# Patient Record
Sex: Female | Born: 1955 | Race: Black or African American | Hispanic: No | Marital: Single | Smoking: Former smoker
Health system: Southern US, Community
[De-identification: ages and names within clinical notes are randomized; demographics above are authoritative.]

## PROBLEM LIST (undated history)

## (undated) DIAGNOSIS — C159 Malignant neoplasm of esophagus, unspecified: Secondary | ICD-10-CM

## (undated) DIAGNOSIS — J690 Pneumonitis due to inhalation of food and vomit: Secondary | ICD-10-CM

## (undated) DIAGNOSIS — I10 Essential (primary) hypertension: Secondary | ICD-10-CM

## (undated) DIAGNOSIS — J860 Pyothorax with fistula: Secondary | ICD-10-CM

## (undated) DIAGNOSIS — R000 Tachycardia, unspecified: Secondary | ICD-10-CM

---

## 2014-07-18 HISTORY — PX: PEG PLACEMENT: SHX5437

## 2014-07-28 HISTORY — PX: TRACHEOSTOMY: SUR1362

## 2014-08-31 LAB — HEMOGLOBIN A1C: Hgb A1c MFr Bld: 5.4 % (ref 4.0–6.0)

## 2014-09-10 LAB — POCT INR: INR: 1 (ref <=1.1)

## 2014-09-10 LAB — PROTIME-INR: PROTIME: 13.8 s (ref 10.0–13.8)

## 2014-09-11 LAB — HEPATIC FUNCTION PANEL
ALT: 14 U/L (ref 7–35)
AST: 20 U/L (ref 13–35)
Alkaline Phosphatase: 92 U/L (ref 25–125)

## 2014-09-18 LAB — CBC AND DIFFERENTIAL
HCT: 28 % — AB (ref 36–46)
Hemoglobin: 9.5 g/dL — AB (ref 12.0–16.0)
Platelets: 759 10*3/uL — AB (ref 150–399)
WBC: 10.1 10*3/mL

## 2014-09-20 LAB — BASIC METABOLIC PANEL
BUN: 9 mg/dL (ref 4–21)
CREATININE: 0.6 mg/dL (ref <=1.1)
GLUCOSE: 108 mg/dL
POTASSIUM: 4.5 mmol/L (ref 3.4–5.3)
Sodium: 132 mmol/L — AB (ref 137–147)

## 2014-09-22 ENCOUNTER — Encounter: Payer: Self-pay | Admitting: Family Medicine

## 2014-09-22 ENCOUNTER — Encounter: Payer: Self-pay | Admitting: Clinical

## 2014-09-22 DIAGNOSIS — I10 Essential (primary) hypertension: Secondary | ICD-10-CM | POA: Insufficient documentation

## 2014-09-22 DIAGNOSIS — F411 Generalized anxiety disorder: Secondary | ICD-10-CM | POA: Insufficient documentation

## 2014-09-22 DIAGNOSIS — C159 Malignant neoplasm of esophagus, unspecified: Secondary | ICD-10-CM | POA: Insufficient documentation

## 2014-09-22 DIAGNOSIS — R000 Tachycardia, unspecified: Secondary | ICD-10-CM | POA: Insufficient documentation

## 2014-09-22 DIAGNOSIS — J9601 Acute respiratory failure with hypoxia: Secondary | ICD-10-CM | POA: Insufficient documentation

## 2014-09-22 DIAGNOSIS — Z720 Tobacco use: Secondary | ICD-10-CM | POA: Insufficient documentation

## 2014-09-22 DIAGNOSIS — F1010 Alcohol abuse, uncomplicated: Secondary | ICD-10-CM | POA: Insufficient documentation

## 2014-09-22 DIAGNOSIS — R918 Other nonspecific abnormal finding of lung field: Secondary | ICD-10-CM | POA: Insufficient documentation

## 2014-09-22 DIAGNOSIS — R64 Cachexia: Secondary | ICD-10-CM | POA: Insufficient documentation

## 2014-09-22 DIAGNOSIS — D649 Anemia, unspecified: Secondary | ICD-10-CM | POA: Insufficient documentation

## 2014-09-22 NOTE — Progress Notes (Signed)
Thank you Dr. Ree Kida for placing the order. It has been sent to [facility].  Hunt Oris, MSW, [place]

## 2014-09-22 NOTE — Progress Notes (Signed)
[place] agencies were not able to accept pt's [place] order either due to low staffing, not accepting insurance or unable to treat pt's with a trach. CSW was informed by [employer] that pt will need to have a face to face in clinic as a face to face in the out of state hospital will not be accepted. CSW also informed that pt's family has received education on how to manage pt's trach and other needs. CSW will await an update from PCP to determine what home health needs (if any) pt has. CSW will call daughter Bambi with pt's appt for 11/17 at 1:30p.  Hunt Oris, MSW, [place]

## 2014-09-22 NOTE — Progress Notes (Signed)
Received call from Dr. Myrene Galas at [facility] on 09/21/14. She is currently caring for Shelby Armstrong.  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 She is s/p esophogeal stent on 07/28/14. She is also s/p tracheostomy on 07/28/14. Her recent hospitalization has been complicated by sepsis due to aspiration PNA. She is status post XRT radiation however oncology is not recommending chemo at this time. Patient and family are hesitant to enter Hospice. Family members which to bring Shelby Armstrong back to [place]. Family wishes to establish care at [facility]. Needs order for home health. I have agreed to accept the patient into my practice and am willing to place the order for home health.  I have discussed this patient with my social worker Hunt Oris who will help to facilitate the home health.   Dossie Arbour MD

## 2014-09-26 ENCOUNTER — Ambulatory Visit: Payer: BC Managed Care – PPO | Admitting: Family Medicine

## 2014-09-26 ENCOUNTER — Telehealth: Payer: Self-pay | Admitting: Clinical

## 2014-09-26 NOTE — Telephone Encounter (Signed)
CSW called daughter again and apologized that pts appointtment was actually today. Daughter agreeable to bring pt tomorrow at 3:30p. PCP agreeable to see pt tomorrow, 09/27/14.  Hunt Oris, MSW, [place]

## 2014-09-26 NOTE — Telephone Encounter (Signed)
CSW called pt's daughter to inform her that pt has an appt tomorrow at 1:30p with her PCP. Daughter agreeable to bring pt.and states that pt is doing well, "she's up talking."  CSW also received pt's dc summary from [facility]. A copy has been placed in PCP's box and another to be scanned into pt's chart.  Hunt Oris, MSW, [place]

## 2014-09-27 ENCOUNTER — Ambulatory Visit (INDEPENDENT_AMBULATORY_CARE_PROVIDER_SITE_OTHER): Payer: BC Managed Care – PPO | Admitting: Family Medicine

## 2014-09-27 ENCOUNTER — Encounter: Payer: Self-pay | Admitting: Family Medicine

## 2014-09-27 VITALS — BP 138/78 | HR 137 | Temp 98.4°F | Ht 61.0 in | Wt 82.8 lb

## 2014-09-27 DIAGNOSIS — R0781 Pleurodynia: Secondary | ICD-10-CM

## 2014-09-27 DIAGNOSIS — I10 Essential (primary) hypertension: Secondary | ICD-10-CM

## 2014-09-27 DIAGNOSIS — R000 Tachycardia, unspecified: Secondary | ICD-10-CM

## 2014-09-27 DIAGNOSIS — C159 Malignant neoplasm of esophagus, unspecified: Secondary | ICD-10-CM

## 2014-09-27 NOTE — Patient Instructions (Signed)
It was a pleasure to meet you today.  You have been referred to an oncologist. My office will contact you in the next few days.  Side pain - likely due to muscle spasm/increased muscle use due to breathing, apply heat 2-3 times per day and my use Tylenol (1-2 325 mg tablets three times a day as needed). If your pain is not improved please call and I will provided a stronger pain medication.   Return for follow up after you have been seen by the oncologist.

## 2014-09-28 DIAGNOSIS — R0781 Pleurodynia: Secondary | ICD-10-CM | POA: Insufficient documentation

## 2014-09-28 NOTE — Assessment & Plan Note (Signed)
Bilateral MSK/rib pain. Likely due to increased work of breathing from collapsed left lung. -attempt trial of heat and Tylenol -If no improvement will add Tramadol to pain regimen

## 2014-09-28 NOTE — Assessment & Plan Note (Signed)
Blood pressure in acceptable range on Amlodipine and Metoprolol.

## 2014-09-28 NOTE — Assessment & Plan Note (Signed)
Patient has persistent tachycardia. Echo and CTA at [facility] unremarkable. Likely due to [facility] and cachectic state. -will monitor clinically at this time

## 2014-09-28 NOTE — Progress Notes (Signed)
   Subjective:    Patient ID: Shelby Armstrong, female    DOB: 02-16-1956, 58 y.o.   MRN: [id number]  HPI 58 y/o female presents for establishment of care and face to face visit for home health. She is accompanied today by her daughter Shelby Armstrong.  [name] was discharged from the [facility] on 09/22/14. She was admitted 2/2 SCC of the esophagus. 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 This occlusion was due to external compression from the [facility] of the esophagus. She also was found to have a tracheoesophageal fistula that required esophageal stenting. She required PEG and trach placement. The trach has since been removed. She underwent palliative radiation however the oncology team at [facility] felt that she was not a candidate for chemotherapy. Per the primary team a discussion on hospice was had with the patient and family however they refused hospice services. Patient and family interested in second opinion.   [name] is currently living with her daughter. She requires home health services due to inability to perform ADLs without assistance. Today she complains of bilateral side pain (over the ribs), made worse by laying down, no associated rash, she reports some sob, no cough, no fevers/chills, no dysuria, no abdominal pain. She is tolerating tube feeds. Tube feeds are being delivered by [facility]  I have reviewed the medical records and discharge summary from [facility]. I have reviewed and updated the patients PMH/PSH/Social hx/Medications/Allergies and updated in EPIC.    Review of Systems  Constitutional: Positive for fatigue. Negative for fever and chills.  Respiratory: Positive for shortness of breath. Negative for choking.   Cardiovascular: Negative for chest pain.  Gastrointestinal: Negative for nausea, vomiting, abdominal pain and diarrhea.       Objective:   Physical Exam Vitals:  reviewed, tachycardic Gen: pleasant AAF, cachectic  HEENT: normocphalic, PERRL, EOMI, nasal septum midline, no rhinorrhea, MMM, uvula midline, no pharyngeal erythema or exudate noted, neck supple, no adenopathy, trach stoma present (appears to be healing well) Cardiac: Tachycardic, no murmurs, no heaves/thrills, No JVD Resp: no breath sounds in left lung, right lung clear to auscultation, normal work of breathing MSK: bilateral lower rib pain to palpation,no joint effusion Skin: no rash, no suspicious skin lesions Abd: soft, no tenderness, PEG tube present, no erythema or drainage from ostomy Ext: thin, 2+ radial and DP pulses, no edema Psych: dressed appropriately, daughter did most of talking, A&O x3, affect appeared flat     Assessment & Plan:  Please see problem specific assessment and plan.

## 2014-09-28 NOTE — Assessment & Plan Note (Addendum)
58 y/o female presents for establishment of care. She was discharged from [facility] on 09/22/14. Known SCC of esophagus. -Patient very weak/cachectic from [facility] and hospitalization, requires home health, order has been placed, [facility] [place] SW working to get home heath coordinated, will also place order for home health PT services -Patient and family decline hospice services, wish for second opinion first, referral placed to Oncology at [facility]

## 2014-09-29 ENCOUNTER — Encounter: Payer: Self-pay | Admitting: Family Medicine

## 2014-09-29 NOTE — Addendum Note (Signed)
Addended by: Lupita Dawn on: 09/29/2014 09:01 AM   Modules accepted: Orders, Level of Service

## 2014-09-29 NOTE — Progress Notes (Signed)
CSW has sent orders to [facility] and confirmed that they will accept pt.  Shelby Armstrong, MSW, [place]

## 2014-10-03 ENCOUNTER — Telehealth: Payer: Self-pay | Admitting: Hematology

## 2014-10-03 NOTE — Telephone Encounter (Signed)
left message for patient to return call to schedule np appt.  °

## 2014-10-04 ENCOUNTER — Telehealth: Payer: Self-pay | Admitting: Hematology

## 2014-10-04 NOTE — Telephone Encounter (Signed)
S/W PATIENT DTR AND GAVE NP APPT FOR 12/07 @ 2:30 W/DR. FENG DX- SCC REFERRING DR. FLETKE WELCOME PACKET MAILED W/CALENDAR  INFORMATION IN EPIC FOR REVIEW

## 2014-10-04 NOTE — Telephone Encounter (Signed)
LEFT MESSAGE FOR PATIENT TO RETURN CALL TO SCHEDULE NP APPT.  °

## 2014-10-09 ENCOUNTER — Telehealth: Payer: Self-pay | Admitting: *Deleted

## 2014-10-09 MED ORDER — TRAMADOL HCL 50 MG PO TABS: 50.0000 mg | ORAL_TABLET | Freq: Three times a day (TID) | ORAL | Status: AC | PRN

## 2014-10-09 NOTE — Telephone Encounter (Signed)
Spoke to patient's daughter. Patient continues to have back and hip pain. Not controlled with PRN tylenol. Will attempt trial of Tramadol.   Note to nursing staff - please call in Tramadol 50 mg TID PRN pain, dispense #30, refill #0, thanks

## 2014-10-09 NOTE — Telephone Encounter (Signed)
Will forward to PCP 

## 2014-10-09 NOTE — Telephone Encounter (Signed)
Rx called in to [facility] on [address]

## 2014-10-09 NOTE — Telephone Encounter (Signed)
Pt dgt calls, she states that Dr. Ree Kida told them that if her mom was having uncontrollable pain to call and he would give her something stronger. Latanya Hemmer, [place]

## 2014-10-11 ENCOUNTER — Inpatient Hospital Stay (HOSPITAL_COMMUNITY)
Admission: EM | Admit: 2014-10-11 | Discharge: 2014-11-10 | DRG: 377 | Disposition: E | Payer: BC Managed Care – PPO | Attending: Family Medicine | Admitting: Family Medicine

## 2014-10-11 ENCOUNTER — Encounter (HOSPITAL_COMMUNITY): Payer: Self-pay | Admitting: *Deleted

## 2014-10-11 ENCOUNTER — Emergency Department (HOSPITAL_COMMUNITY): Payer: BC Managed Care – PPO

## 2014-10-11 DIAGNOSIS — E43 Unspecified severe protein-calorie malnutrition: Secondary | ICD-10-CM | POA: Diagnosis present

## 2014-10-11 DIAGNOSIS — R000 Tachycardia, unspecified: Secondary | ICD-10-CM

## 2014-10-11 DIAGNOSIS — I480 Paroxysmal atrial fibrillation: Secondary | ICD-10-CM | POA: Diagnosis present

## 2014-10-11 DIAGNOSIS — R011 Cardiac murmur, unspecified: Secondary | ICD-10-CM | POA: Diagnosis present

## 2014-10-11 DIAGNOSIS — Z803 Family history of malignant neoplasm of breast: Secondary | ICD-10-CM | POA: Diagnosis not present

## 2014-10-11 DIAGNOSIS — Z7401 Bed confinement status: Secondary | ICD-10-CM

## 2014-10-11 DIAGNOSIS — Z923 Personal history of irradiation: Secondary | ICD-10-CM

## 2014-10-11 DIAGNOSIS — C7951 Secondary malignant neoplasm of bone: Secondary | ICD-10-CM | POA: Diagnosis present

## 2014-10-11 DIAGNOSIS — D649 Anemia, unspecified: Secondary | ICD-10-CM | POA: Diagnosis present

## 2014-10-11 DIAGNOSIS — D638 Anemia in other chronic diseases classified elsewhere: Secondary | ICD-10-CM | POA: Diagnosis present

## 2014-10-11 DIAGNOSIS — F411 Generalized anxiety disorder: Secondary | ICD-10-CM | POA: Diagnosis present

## 2014-10-11 DIAGNOSIS — Z7982 Long term (current) use of aspirin: Secondary | ICD-10-CM

## 2014-10-11 DIAGNOSIS — R64 Cachexia: Secondary | ICD-10-CM | POA: Diagnosis present

## 2014-10-11 DIAGNOSIS — E860 Dehydration: Secondary | ICD-10-CM | POA: Diagnosis present

## 2014-10-11 DIAGNOSIS — I10 Essential (primary) hypertension: Secondary | ICD-10-CM | POA: Diagnosis present

## 2014-10-11 DIAGNOSIS — C159 Malignant neoplasm of esophagus, unspecified: Secondary | ICD-10-CM | POA: Diagnosis present

## 2014-10-11 DIAGNOSIS — Z801 Family history of malignant neoplasm of trachea, bronchus and lung: Secondary | ICD-10-CM

## 2014-10-11 DIAGNOSIS — J9811 Atelectasis: Secondary | ICD-10-CM | POA: Diagnosis present

## 2014-10-11 DIAGNOSIS — G8929 Other chronic pain: Secondary | ICD-10-CM | POA: Diagnosis present

## 2014-10-11 DIAGNOSIS — M7989 Other specified soft tissue disorders: Secondary | ICD-10-CM | POA: Diagnosis present

## 2014-10-11 DIAGNOSIS — R627 Adult failure to thrive: Secondary | ICD-10-CM | POA: Diagnosis present

## 2014-10-11 DIAGNOSIS — R451 Restlessness and agitation: Secondary | ICD-10-CM | POA: Diagnosis not present

## 2014-10-11 DIAGNOSIS — Z931 Gastrostomy status: Secondary | ICD-10-CM | POA: Diagnosis not present

## 2014-10-11 DIAGNOSIS — R6510 Systemic inflammatory response syndrome (SIRS) of non-infectious origin without acute organ dysfunction: Secondary | ICD-10-CM | POA: Diagnosis present

## 2014-10-11 DIAGNOSIS — K922 Gastrointestinal hemorrhage, unspecified: Secondary | ICD-10-CM | POA: Diagnosis present

## 2014-10-11 DIAGNOSIS — J90 Pleural effusion, not elsewhere classified: Secondary | ICD-10-CM | POA: Diagnosis present

## 2014-10-11 DIAGNOSIS — Z87891 Personal history of nicotine dependence: Secondary | ICD-10-CM | POA: Diagnosis not present

## 2014-10-11 DIAGNOSIS — E871 Hypo-osmolality and hyponatremia: Secondary | ICD-10-CM | POA: Diagnosis present

## 2014-10-11 DIAGNOSIS — M799 Soft tissue disorder, unspecified: Secondary | ICD-10-CM | POA: Diagnosis not present

## 2014-10-11 DIAGNOSIS — Z66 Do not resuscitate: Secondary | ICD-10-CM | POA: Diagnosis present

## 2014-10-11 DIAGNOSIS — Z681 Body mass index (BMI) 19 or less, adult: Secondary | ICD-10-CM | POA: Diagnosis not present

## 2014-10-11 DIAGNOSIS — R0682 Tachypnea, not elsewhere classified: Secondary | ICD-10-CM

## 2014-10-11 DIAGNOSIS — Z79899 Other long term (current) drug therapy: Secondary | ICD-10-CM | POA: Diagnosis not present

## 2014-10-11 DIAGNOSIS — Z930 Tracheostomy status: Secondary | ICD-10-CM

## 2014-10-11 DIAGNOSIS — K5900 Constipation, unspecified: Secondary | ICD-10-CM | POA: Diagnosis present

## 2014-10-11 DIAGNOSIS — R062 Wheezing: Secondary | ICD-10-CM

## 2014-10-11 HISTORY — DX: Pneumonitis due to inhalation of food and vomit: J69.0

## 2014-10-11 HISTORY — DX: Pyothorax with fistula: J86.0

## 2014-10-11 HISTORY — DX: Tachycardia, unspecified: R00.0

## 2014-10-11 HISTORY — DX: Essential (primary) hypertension: I10

## 2014-10-11 HISTORY — DX: Malignant neoplasm of esophagus, unspecified: C15.9

## 2014-10-11 LAB — CBC WITH DIFFERENTIAL/PLATELET
BASOS ABS: 0 10*3/uL (ref 0.0–0.1)
Basophils Relative: 0 % (ref 0–1)
Eosinophils Absolute: 0.1 10*3/uL (ref 0.0–0.7)
Eosinophils Relative: 0 % (ref 0–5)
HCT: 32.7 % — ABNORMAL LOW (ref 36.0–46.0)
Hemoglobin: 11 g/dL — ABNORMAL LOW (ref 12.0–15.0)
LYMPHS PCT: 7 % — AB (ref 12–46)
Lymphs Abs: 1 10*3/uL (ref 0.7–4.0)
MCH: 28.8 pg (ref 26.0–34.0)
MCHC: 33.6 g/dL (ref 30.0–36.0)
MCV: 85.6 fL (ref 78.0–100.0)
Monocytes Absolute: 1.2 10*3/uL — ABNORMAL HIGH (ref 0.1–1.0)
Monocytes Relative: 9 % (ref 3–12)
Neutro Abs: 11.3 10*3/uL — ABNORMAL HIGH (ref 1.7–7.7)
Neutrophils Relative %: 84 % — ABNORMAL HIGH (ref 43–77)
PLATELETS: 756 10*3/uL — AB (ref 150–400)
RBC: 3.82 MIL/uL — AB (ref 3.87–5.11)
RDW: 16.8 % — AB (ref 11.5–15.5)
WBC: 13.4 10*3/uL — ABNORMAL HIGH (ref 4.0–10.5)

## 2014-10-11 LAB — COMPREHENSIVE METABOLIC PANEL
ALT: 16 U/L (ref 0–35)
AST: 47 U/L — ABNORMAL HIGH (ref 0–37)
Albumin: 2.5 g/dL — ABNORMAL LOW (ref 3.5–5.2)
Alkaline Phosphatase: 98 U/L (ref 39–117)
Anion gap: 14 (ref 5–15)
BUN: 21 mg/dL (ref 6–23)
CO2: 28 meq/L (ref 19–32)
CREATININE: 0.48 mg/dL — AB (ref 0.50–1.10)
Calcium: 13.5 mg/dL (ref 8.4–10.5)
Chloride: 90 mEq/L — ABNORMAL LOW (ref 96–112)
Glucose, Bld: 98 mg/dL (ref 70–99)
Potassium: 4.7 mEq/L (ref 3.7–5.3)
Sodium: 132 mEq/L — ABNORMAL LOW (ref 137–147)
TOTAL PROTEIN: 9.1 g/dL — AB (ref 6.0–8.3)
Total Bilirubin: 0.3 mg/dL (ref 0.3–1.2)

## 2014-10-11 LAB — URINALYSIS, ROUTINE W REFLEX MICROSCOPIC
Bilirubin Urine: NEGATIVE
Glucose, UA: NEGATIVE mg/dL
Hgb urine dipstick: NEGATIVE
Ketones, ur: NEGATIVE mg/dL
LEUKOCYTES UA: NEGATIVE
Nitrite: NEGATIVE
Protein, ur: NEGATIVE mg/dL
Specific Gravity, Urine: 1.013 (ref 1.005–1.030)
UROBILINOGEN UA: 0.2 mg/dL (ref 0.0–1.0)
pH: 7 (ref 5.0–8.0)

## 2014-10-11 LAB — LACTIC ACID, PLASMA: LACTIC ACID, VENOUS: 1.8 mmol/L (ref 0.5–2.2)

## 2014-10-11 LAB — TROPONIN I: Troponin I: <0.3 ng/mL (ref <0.30)

## 2014-10-11 LAB — OCCULT BLOOD GASTRIC / DUODENUM (SPECIMEN CUP): OCCULT BLOOD, GASTRIC: POSITIVE — AB

## 2014-10-11 LAB — LIPASE, BLOOD: Lipase: 12 U/L (ref 11–59)

## 2014-10-11 MED ORDER — JEVITY 1.2 CAL PO LIQD
237.0000 mL | Freq: Four times a day (QID) | ORAL | Status: DC
  Administered 2014-10-11 – 2014-10-12 (×4): 237 mL
  Filled 2014-10-11 (×9): qty 237

## 2014-10-11 MED ORDER — SODIUM CHLORIDE 0.9 % IV BOLUS (SEPSIS)
250.0000 mL | Freq: Once | INTRAVENOUS | Status: AC
  Administered 2014-10-11: 250 mL via INTRAVENOUS

## 2014-10-11 MED ORDER — ACETAMINOPHEN 325 MG PO TABS
650.0000 mg | ORAL_TABLET | Freq: Three times a day (TID) | ORAL | Status: DC | PRN
Start: 2014-10-11 — End: 2014-10-17

## 2014-10-11 MED ORDER — IRON DEXTRAN-FOLIC ACID-B12 100-1000-15 MG-MCG/5ML PO LIQD: 5.0000 mL | Freq: Every day | ORAL | Status: DC

## 2014-10-11 MED ORDER — CALCITONIN (SALMON) 200 UNIT/ML IJ SOLN
4.0000 [IU]/kg | Freq: Two times a day (BID) | INTRAMUSCULAR | Status: DC
  Administered 2014-10-11 – 2014-10-15 (×8): 150 [IU] via SUBCUTANEOUS
  Administered 2014-10-16: 4 [IU] via SUBCUTANEOUS
  Administered 2014-10-16 – 2014-10-17 (×2): 150 [IU] via SUBCUTANEOUS
  Filled 2014-10-11 (×18): qty 0.75

## 2014-10-11 MED ORDER — AMLODIPINE BESYLATE 10 MG PO TABS
10.0000 mg | ORAL_TABLET | Freq: Every day | ORAL | Status: DC
  Administered 2014-10-11 – 2014-10-13 (×3): 10 mg
  Filled 2014-10-11 (×4): qty 1

## 2014-10-11 MED ORDER — METOPROLOL TARTRATE 50 MG PO TABS
50.0000 mg | ORAL_TABLET | Freq: Two times a day (BID) | ORAL | Status: DC
  Administered 2014-10-11 – 2014-10-17 (×12): 50 mg
  Filled 2014-10-11 (×3): qty 1
  Filled 2014-10-11: qty 2
  Filled 2014-10-11 (×11): qty 1

## 2014-10-11 MED ORDER — TRAMADOL HCL 50 MG PO TABS
50.0000 mg | ORAL_TABLET | Freq: Three times a day (TID) | ORAL | Status: DC | PRN
  Administered 2014-10-11 – 2014-10-12 (×2): 50 mg via ORAL
  Filled 2014-10-11 (×3): qty 1

## 2014-10-11 MED ORDER — DULOXETINE HCL 30 MG PO CPEP
30.0000 mg | ORAL_CAPSULE | Freq: Every day | ORAL | Status: DC
  Administered 2014-10-12 – 2014-10-15 (×4): 30 mg via ORAL
  Filled 2014-10-11 (×4): qty 1

## 2014-10-11 MED ORDER — IRON DEXTRAN-FOLIC ACID-B12 100-1000-15 MG-MCG/5ML PO LIQD
100.0000 ug | Freq: Every day | ORAL | Status: DC
  Filled 2014-10-11 (×2): qty 5

## 2014-10-11 MED ORDER — FUROSEMIDE 10 MG/ML IJ SOLN
20.0000 mg | Freq: Once | INTRAMUSCULAR | Status: AC
  Administered 2014-10-11: 20 mg via INTRAVENOUS
  Filled 2014-10-11: qty 2

## 2014-10-11 MED ORDER — SODIUM CHLORIDE 0.9 % IV SOLN
INTRAVENOUS | Status: DC
  Administered 2014-10-11: 11:00:00 via INTRAVENOUS

## 2014-10-11 MED ORDER — SODIUM CHLORIDE 0.9 % IV SOLN
INTRAVENOUS | Status: DC
  Administered 2014-10-11 – 2014-10-12 (×4): via INTRAVENOUS
  Administered 2014-10-14: 75 mL via INTRAVENOUS

## 2014-10-11 MED ORDER — VITAMIN B-1 100 MG PO TABS
100.0000 mg | ORAL_TABLET | Freq: Every day | ORAL | Status: DC
  Administered 2014-10-11 – 2014-10-15 (×5): 100 mg
  Filled 2014-10-11 (×5): qty 1

## 2014-10-11 MED ORDER — SODIUM CHLORIDE 0.9 % IV SOLN: INTRAVENOUS | Status: DC

## 2014-10-11 MED ORDER — SODIUM CHLORIDE 0.9 % IV BOLUS (SEPSIS)
500.0000 mL | Freq: Once | INTRAVENOUS | Status: AC
  Administered 2014-10-11: 500 mL via INTRAVENOUS

## 2014-10-11 MED ORDER — PANTOPRAZOLE SODIUM 40 MG PO PACK
40.0000 mg | PACK | Freq: Every day | ORAL | Status: DC
  Administered 2014-10-11 – 2014-10-17 (×7): 40 mg
  Filled 2014-10-11 (×7): qty 20

## 2014-10-11 MED ORDER — FENTANYL CITRATE 0.05 MG/ML IJ SOLN
25.0000 ug | INTRAMUSCULAR | Status: DC | PRN
  Administered 2014-10-11: 25 ug via INTRAVENOUS
  Filled 2014-10-11: qty 2

## 2014-10-11 MED ORDER — JEVITY 1.5 CAL PO LIQD: 1000.0000 mL | Freq: Four times a day (QID) | ORAL | Status: DC

## 2014-10-11 MED ORDER — SODIUM CHLORIDE 0.9 % IJ SOLN
3.0000 mL | Freq: Two times a day (BID) | INTRAMUSCULAR | Status: DC
  Administered 2014-10-12 – 2014-10-17 (×9): 3 mL via INTRAVENOUS

## 2014-10-11 MED ORDER — THIAMINE HCL 100 MG PO TABS: 100.0000 mg | ORAL_TABLET | Freq: Every day | ORAL | Status: DC

## 2014-10-11 NOTE — Progress Notes (Signed)
NURSING PROGRESS NOTE  Nirvi Boehler [id number] Admission Data: 10/14/2014 7:27 PM Attending Provider: Willeen Niece, MD [id number], Mary Sella, MD Code Status: FULL  Shelby Armstrong is a 58 y.o. female patient admitted from ED:  -No acute distress noted.  -No complaints of shortness of breath.  -No complaints of chest pain.    Blood pressure 147/85, pulse 139, temperature 98.2 F (36.8 C), temperature source Oral, resp. rate 22, height 5\' 1"  (1.549 m), weight 37.558 kg (82 lb 12.8 oz), SpO2 100 %.   IV Fluids:  IV in place, occlusive dsg intact without redness, IV cath forearm right, condition patent and no redness normal saline.   Allergies:  Review of patient's allergies indicates no known allergies.  Past Medical History:   has a past medical history of Tachycardia; Tracheoesophageal fistula; Hypertension; Aspiration pneumonia (~ 07/2014); and Squamous cell carcinoma of esophagus (dx'd ~ 07/2014).  Past Surgical History:   has past surgical history that includes PEG placement (07/18/14) and Tracheostomy (07/28/14).   Skin: fissure noted to sacrum. Peg tube site noted to LLQ  Patient/Family orientated to room. Information packet given to patient/family. Admission inpatient armband information verified with patient/family to include name and date of birth and placed on patient arm. Side rails up x 2, fall assessment and education completed with patient/family. Patient/family able to verbalize understanding of risk associated with falls and verbalized understanding to call for assistance before getting out of bed. Call light within reach. Patient/family able to voice and demonstrate understanding of unit orientation instructions.    Will continue to evaluate and treat per MD orders.  Wallie Renshaw, RN

## 2014-10-11 NOTE — ED Notes (Signed)
Patient transported to [place] via [facility] 

## 2014-10-11 NOTE — ED Notes (Signed)
Bed: WA22 Expected date:  Expected time:  Means of arrival:  Comments: 

## 2014-10-11 NOTE — ED Notes (Addendum)
Still awaiting [facility] transport to [employer], family updated with status.

## 2014-10-11 NOTE — ED Notes (Signed)
Patient taken to radiology Will obtain labs when patient returns

## 2014-10-11 NOTE — ED Notes (Signed)
Patient states that her feeding tube has been "backed up" since last night Patient had feeding tube placed about 3 months ago, per patient's family Patient noted to have elevated HR in triage--EKG obtained, patient denies CP Patient with hx of esophageal cancer

## 2014-10-11 NOTE — H&P (Addendum)
FMTS Attending Admission Note: Annabell Sabal MD Personal pager:  [phone] FPTS Service Pager:  [phone]  Briefly, 58 yo F recently moved from [place] with diagnosis of esophageal cancer.  Had unsuccessful attempt at radiative palliation at [facility] x 1.  None since then, at least 1 month ago.  Per records, multiple physicians have been recommending Hospice, but family not yet on board.  Has known Left lung collapse/radiographic white-out since before moving from [facility].  Presented with gradual worsening of pain over past days to weeks.  Yesterday family noted what appeared to be brownish reflux through PEG tube, placed due to esophageal CA.  Family concerned for blood, brought to ED.  Txed to [facility] and admitted to [facility].  Exam: Gen:  AAF lying in bed, appears older than stated age.  Cachectic, chronically ill appearing. HEENT:  Arcus present BL.  EOMI, PERRL.  Dry mucus membranes Heart:  Tachy but regular rhythm. Lungs:  Right side with good movement.  Left side with hyper-resonant breath sounds  Abd:  PEG in place without surrounding erythema.  Scaphoid abdomen. Ext:  Roughly 10 cm in diameter firm mass noted posterior Right thigh.  Mildly TTP.    Imp/Plan: 1.  Hypercalcemia:   - likely contributing to feelings of malaise and pain.   - agree with fluids and lasix.  - unknown HF status.  Watch for overload  2.  Possible GI bleed: - stable Hgb.  Gastrocult pending. - Agree with PPI  3.  Esophageal CA:  - poor prognosis.  Very thin, poor nutritional status. - Driving all decisions.  Needs Palliative care consult  4.  Radiographic abnormalities, lungs: - white-out Left lung.  Likely post-obstructive.  Questionable PNA via radiographs -- but same opacity noted in about 1 month ago without change. - no fevers, cough, chills.  Does have leukocytosis.  Favor observation.  5. Leg mass: - Concerning for malignancy. - Doesn't appear fixed -- but she has little leg mass for mass  to adhere - Again, push for palliation.  Would only biopsy to help in prognosis.  6.  Code Status:  - of note, patient DNR STATUS on [facility] paperwork. - need to confirm this with family ASAP  Alveda Reasons, MD 11/06/2014

## 2014-10-11 NOTE — ED Notes (Signed)
Spoke with [facility] who is on their way. Report given to Olivia Mackie, [place]

## 2014-10-11 NOTE — H&P (Signed)
[facility] Admission History and Physical Service Pager: [phone]  Patient name: Shelby Armstrong Medical record number: [id number] Date of birth: 16-Sep-1956 Age: 58 y.o. Gender: female  Primary Care Provider: Lupita Dawn, MD Consultants: None Code Status: Full, confirmed on admission, discussed with patient and family  Chief Complaint: Blood in PEG tube  Assessment and Plan: Shelby Armstrong is a 58 y.o. female presenting with concern for blood in her PEG tube and hypercalcemia . PMH is significant for Esophageal Squamous Cell Carcinoma  # Hypercalcemia. Patient's calcium on admission corrects to 14.7. Patient has prior history per chart review (Ca ~11). Likely related to patient's malignancy. Patient also appears clinically dehydrated likely secondary to hypercalcemia induced diuresis, or potentially dehydration may be contributing to her hypercalcemia.  Her hypercalcemia may also be contributing to patient's overall fatigue and dehydration. Patient currently has normal mental status. She does note diffuse pain and constipation was noted on abd XR, which could be related to elevated calcium. -Will give NS @ 200cc/hr -Calcitonin and Lasix -Consider starting bisphosphonate therapy for long term control -f/u AM CMP -Continue to monitor for mental status changes -Monitor on telemetry  # Possible GI bleed. HgB stable on admission (11.0). No bright red blood to suggest acute GI bleed. -f/u gastric occult blood -Protonix 40mg  daily - if this is a true GI bleed, it is likely a slow bleed given stable Hgb and color of fluid - will need to consider GI vs surgical c/s in the am if occult blood positive  # Esophageal Cancer. Previously followed at [facility] oncology. Recently moved to [facility] for second opinion. Has appointment on 12/7 with onc in [place]. Has diffuse body pains and weakness related to cancer diagnosis. 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  -Home tylenol and tramadol prn pain -All nutrition through PEG tube -Nutrition consulted -NPO at this time - monitor respiratory status given chronic lung issues  # SIRS. Leukocytosis (13.4), tachycardia, and tachypnea (22). Tachycardia chronic per patient and her family. No obvious source of infection. UA and CXR unremarkable. Lactic acid normal. Likely tachy and tachypneic relating to disease burden. - EKG shows sinus tachycardia - f/u urine culture - Initiate broad spectrum antibiotics and obtain blood cultures if patient deteriorates clinically.  - Continue to monitor  # Chronic Anemia. - Continue home iron supplementation  # HTN.  - continue home amlodipine and metoprolol  # Anxiety - continue home cymbalta  # Constipation: evident on imaging. Not a complaint of patient. - consider bowel regimen if no BM in am  # Right posterior thigh mass: concern would be for malignant process given history. Possibly a primary malignant process vs metastasis. - will need to consider further imaging of the right thigh and possible biopsy of this lesion  # Hyponatremia: stable from previous value 3 weeks ago. Potentially related to poor intake, though less likely with feedings through PEG tube. - IVF per above - BMET in am.   FEN/GI: NPO, Nutrition consulted for tube feedings, NS @200cc /hr Prophylaxis: SCDs until GI bleed is ruled out.   Disposition: Admitted to telemetry under attending Dr Lindell Noe pending above management and evaluation.  History of Present Illness: Shelby Armstrong is a 58 y.o. female presenting from [place] ED with blood in her PEG tube since last night. This has not previously happened before. Patient endorses some nausea and nobloody vomiting every other day. She receives  all of her hydration and nutrition through her PEG tube. Last BM yesterday. No bloody stools. Denies constipation or  diarrhea.  Says that she pulled material out of her PEG tube that was "hot."  No subjective fevers, but does endorse some occasional chills.  Otherwise patient is near her baseline status. Patient has a history of esophageal squamous cell carcinoma and was previously followed at [facility] oncology. She recently moved to the area for a second opinion after [facility] told the patient there was nothing left to do for her care and that she should pursue hospice care. Patient has history of "whited out" left lung secondary to post obstructive atelectasis from her cancer. Says that she has increased shortness of breath, but is near her baseline. Has some intermittent coughing with clear sputum and phlegm production. Also reports chronic weakess all over with diffuse body pains that is at her baseline. Additionally reports numbness in all of her extremities that comes and goes. Specifically notes this in her bilateral hands today. When referring to her baseline previously this means since her hospitalization at [facility]. Per review of her records from [facility] she had persistent tachycardia while she was hospitalized and was noted to have an elevated calcium while there as well. Hypercalcemia at that time responded to IVF. Tachycardia at that time was felt to be related to disease burden. Had extensive work-up including echo, troponins, and CTA chest that were all unremarkable for cause for tachycardia. Of note the patient was a DNR/DNI while hospitalized in [facility]. Family notes last radiation therapy was >3 weeks ago. Patient also notes a solid mass on the posterior of her right thigh. She notes this started out small and has grown over several weeks. No apparent pain from this.   In the ED at [facility], initial work up was remarkable for hypercalcemia (Ca 13.5), mild hyponatremia (132), leukocytosis (13.4). Patient's hemoglobin was stable at 11.0. CXR revealed white out of left lung and small left basilar pneumothorax.  Review Of  Systems: Per HPI, otherwise 12 point review of systems was performed and was unremarkable.  Patient Active Problem List   Diagnosis Date Noted  . Squamous cell carcinoma of esophagus 11/07/2014  . Rib pain 09/28/2014  . SCC (squamous cell carcinoma of esophagus) 09/22/2014  . Essential hypertension, benign 09/22/2014  . Alcohol abuse 09/22/2014  . Tobacco abuse 09/22/2014  . Anxiety state 09/22/2014  . Cachexia 09/22/2014  . Lung mass 09/22/2014  . Tachycardia 09/22/2014  . Normocytic anemia 09/22/2014   Past Medical History: Past Medical History  Diagnosis Date  . Squamous cell carcinoma of esophagus   . Tachycardia   . Tracheoesophageal fistula   . Aspiration pneumonia    Past Surgical History: Past Surgical History  Procedure Laterality Date  . Peg placement  07/18/14  . Tracheostomy  07/28/14   Social History: History  Substance Use Topics  . Smoking status: Former Smoker    Start date: 11/10/1976    Quit date: 05/10/2014  . Smokeless tobacco: Not on file  . Alcohol Use: 0.0 oz/week    0 Not specified per week     Comment: History of alchohol abuse   Additional social history: Recently moved to [facility] for second opinion for cancer management Please also refer to relevant sections of EMR.  Family History: Family History  Problem Relation Age of Onset  . Hypertension Mother   . Alcoholism Father   . Asthma Sister   . Breast cancer Sister   . Hypertension  Sister   . Lung cancer Brother    Allergies and Medications: No Known Allergies No current facility-administered medications on file prior to encounter.   Current Outpatient Prescriptions on File Prior to Encounter  Medication Sig Dispense Refill  . acetaminophen (TYLENOL) 325 MG tablet Take 650 mg by mouth every 8 (eight) hours as needed for mild pain.     Marland Kitchen amLODipine (NORVASC) 10 MG tablet 10 mg by PEG Tube route daily.     . DULoxetine (CYMBALTA) 30 MG capsule Take 30 mg by mouth daily.    .  metoprolol (LOPRESSOR) 50 MG tablet 50 mg by PEG Tube route 2 (two) times daily.     . Multiple Vitamin (MULTIVITAMIN) tablet Take 1 tablet by mouth daily.    Marland Kitchen thiamine 100 MG tablet 100 mg by PEG Tube route daily.     . traMADol (ULTRAM) 50 MG tablet Take 1 tablet (50 mg total) by mouth every 8 (eight) hours as needed. (Patient taking differently: Take 50 mg by mouth every 8 (eight) hours as needed for moderate pain. ) 30 tablet 0    Objective: BP 147/81 mmHg  Pulse 139  Temp(Src) 98.2 F (36.8 C) (Oral)  Resp 22  Ht 5\' 1"  (1.549 m)  Wt 82 lb 12.8 oz (37.558 kg)  BMI 15.65 kg/m2  SpO2 100% Exam: General: Thin, Chronically ill appearing, cachetic woman lying in bed in NAD HEENT: Dry appearing MMM, PERRL, EOMI Cardiovascular: Tachycardic, no murmurs appreciated Respiratory: NWOB, upper airway sounds transmitted Abdomen: PEG tube in place with dark red/brown material, +BS, soft, tender around site of PEG, nondistended, no rebound or guarding.  Extremities: No cyanosis, right posterior thigh with tennis ball sized firm mobile mass noted, non tender, no erythema Skin: No rashes or breakdowns noted. Neuro: alert and oriented. CN2-12 intact. Strength 5/5 in upper and lower extremities bilaterally. Sensation to gross touch intact bilaterally.   Labs and Imaging: CBC BMET   Recent Labs Lab 11/04/2014 1015  WBC 13.4*  HGB 11.0*  HCT 32.7*  PLT 756*    Recent Labs Lab 10/24/2014 1015  NA 132*  K 4.7  CL 90*  CO2 28  BUN 21  CREATININE 0.48*  GLUCOSE 98  CALCIUM 13.5*     Urinalysis    Component Value Date/Time   COLORURINE YELLOW 10/26/2014 [place] 10/12/2014 0925   LABSPEC 1.013 10/18/2014 0925   PHURINE 7.0 10/12/2014 0925   GLUCOSEU NEGATIVE 11/01/2014 0925   HGBUR NEGATIVE 10/19/2014 0925   BILIRUBINUR NEGATIVE 10/15/2014 0925   KETONESUR NEGATIVE 10/29/2014 0925   PROTEINUR NEGATIVE 11/05/2014 0925   UROBILINOGEN 0.2 10/14/2014 0925   NITRITE  NEGATIVE 11/07/2014 0925   LEUKOCYTESUR NEGATIVE 10/18/2014 0925    Lactic acid 1.8 Troponin negative EKG: Sinus tachycardia, no acute signs of ischemia  Dg Abd Acute W/chest  10/29/2014   CLINICAL DATA:  Diffuse abdominal pain and nausea. History of esophageal carcinoma.  EXAM: ACUTE ABDOMEN SERIES (ABDOMEN 2 VIEW & CHEST 1 VIEW)  COMPARISON:  None.  FINDINGS: Esophageal stent is in place. There is near complete white out of the left chest with volume loss. Small loculated pneumothorax is seen in the left lower lung zone. The right lung is clear.  Two views of the abdomen show a PEG tube in place which projects in good position. No free intraperitoneal air or evidence of bowel obstruction is identified. Large volume of stool in the rectosigmoid colon is noted.  IMPRESSION: No acute finding  in the abdomen with a large volume of stool in the rectosigmoid colon.  Near complete white of the left chest compatible with effusion and atelectasis. There appears to be a small loculated left basilar pneumothorax.   Electronically Signed   By: Inge Rise M.D.   On: 10/14/2014 09:51   Dimas Chyle, MD 10/21/2014, 3:15 PM PGY-1, [facility] pager: [phone], text pages welcome Upper Level Addendum:  I have seen and evaluated this patient along with Dr. Jerline Pain and reviewed the above note, making necessary revisions in red.   Tommi Rumps, MD Family Medicine PGY-2

## 2014-10-11 NOTE — ED Provider Notes (Signed)
CSN: [id number]     Arrival date & time 11/04/2014  0831 History   First MD Initiated Contact with Patient 11/09/2014 0915     Chief Complaint  Patient presents with  . Abdominal Pain    Feeding tube issue     HPI Pt was seen at 0930.  Per pt and her family, c/o gradual onset and persistence of constant "brown fluid from her PEG tube" that began last night. Pt's daughter states pt has hx of SCC esophagus with complicated clinical course at [facility] last month. Oncology MD at [facility] recommended hospice care, but family states "we brought her to [facility] for a second opinion." Pt has been evaluated by [facility] clinic 2 weeks ago and referred to Heme/Onc MD, but has not been evaluated there yet. Pt is mostly bed bound due to chronic pain "all over due to her cancer." Pt has been taking tylenol and tramadol for her pain. Denies any change in pt's usual chronic cough, no fevers, no CP/SOB, no abd pain, no N/V/D, no black or red blood in PEG or stools.    Past Medical History  Diagnosis Date  . Squamous cell carcinoma of esophagus   . Tachycardia   . Tracheoesophageal fistula   . Aspiration pneumonia    Past Surgical History  Procedure Laterality Date  . Peg placement  07/18/14  . Tracheostomy  07/28/14   Family History  Problem Relation Age of Onset  . Hypertension Mother   . Alcoholism Father   . Asthma Sister   . Breast cancer Sister   . Hypertension Sister   . Lung cancer Brother    History  Substance Use Topics  . Smoking status: Former Smoker    Start date: 11/10/1976    Quit date: 05/10/2014  . Smokeless tobacco: Not on file  . Alcohol Use: 0.0 oz/week    0 Not specified per week     Comment: History of alchohol abuse    Review of Systems ROS: Statement: All systems negative except as marked or noted in the HPI; Constitutional: Negative for fever and chills. ; ; Eyes: Negative for eye pain, redness and discharge. ; ; ENMT: Negative for ear pain, hoarseness, nasal congestion, sinus pressure and  sore throat. ; ; Cardiovascular: Negative for chest pain, palpitations, diaphoresis, dyspnea and peripheral edema. ; ; Respiratory: +chronic cough. Negative for wheezing and stridor. ; ; Gastrointestinal: +"brown fluid from PEG tube." Negative for nausea, vomiting, diarrhea, abdominal pain, blood in stool, hematemesis, jaundice and rectal bleeding. . ; ; Genitourinary: Negative for dysuria, flank pain and hematuria. ; ; Musculoskeletal: Negative for back pain and neck pain. Negative for swelling and trauma.; ; Skin: Negative for pruritus, rash, abrasions, blisters, bruising and skin lesion.; ; Neuro: Negative for headache, lightheadedness and neck stiffness. Negative for weakness, altered level of consciousness , altered mental status, extremity weakness, paresthesias, involuntary movement, seizure and syncope.      Allergies  Review of patient's allergies indicates no known allergies.  Home Medications   Prior to Admission medications   Medication Sig Start Date End Date Taking? Authorizing Provider  acetaminophen (TYLENOL) 325 MG tablet Take 650 mg by mouth every 8 (eight) hours as needed for mild pain.    Yes Historical Provider, MD  amLODipine (NORVASC) 10 MG tablet 10 mg by PEG Tube route daily.    Yes Historical Provider, MD  aspirin 325 MG EC tablet Take 325 mg by mouth daily.   Yes Historical Provider, MD  DULoxetine (CYMBALTA)  30 MG capsule Take 30 mg by mouth daily.   Yes Historical Provider, MD  esomeprazole (NEXIUM) 40 MG packet Take 40 mg by mouth daily before breakfast.   Yes Historical Provider, MD  Iron Dextran-Folic [id number] [id number] MG-MCG/5ML LIQD 100 mcg by PEG Tube route daily.   Yes Historical Provider, MD  metoprolol (LOPRESSOR) 50 MG tablet 50 mg by PEG Tube route 2 (two) times daily.    Yes Historical Provider, MD  Multiple Vitamin (MULTIVITAMIN) tablet Take 1 tablet by mouth daily.   Yes Historical Provider, MD  Nutritional Supplements (FEEDING SUPPLEMENT, JEVITY 1.5  CAL,) LIQD Place 1,000 mLs into feeding tube 4 (four) times daily.   Yes Historical Provider, MD  thiamine 100 MG tablet 100 mg by PEG Tube route daily.    Yes Historical Provider, MD  traMADol (ULTRAM) 50 MG tablet Take 1 tablet (50 mg total) by mouth every 8 (eight) hours as needed. Patient taking differently: Take 50 mg by mouth every 8 (eight) hours as needed for moderate pain.  10/09/14  Yes Lupita Dawn, MD   BP 135/82 mmHg  Pulse 139  Temp(Src) 99.6 F (37.6 C) (Rectal)  Resp 23  SpO2 97% Physical Exam  0935: Physical examination:  Nursing notes reviewed; Vital signs and O2 SAT reviewed;  Constitutional: Cachectic. In no acute distress; Head:  Normocephalic, atraumatic; Eyes: EOMI, PERRL, No scleral icterus; ENMT: Mouth and pharynx normal, Mucous membranes dry and cracked; Neck: Supple, Full range of motion, No lymphadenopathy; Cardiovascular: Tachycardic rate and rhythm, No gallop; Respiratory: Breath sounds coarse & equal bilaterally, No wheezes.  Speaking full sentences with ease, Normal respiratory effort/excursion; Chest: Nontender, Movement normal; Abdomen: Soft, +PEG in place without surrounding erythema or drainage. Nontender, Nondistended, Normal bowel sounds; Genitourinary: No CVA tenderness; Extremities: Pulses normal, No tenderness, No edema, No calf edema or asymmetry.; Neuro: AA&Ox3, Major CN grossly intact.  Speech clear. Moves all extremities on stretcher spontaneously.; Skin: Color normal, Warm, Dry.   ED Course  Procedures     EKG Interpretation   Date/Time:  Wednesday October 11 2014 08:59:12 EST  on arrival  Ventricular Rate:  138 PR Interval:  54 QRS Duration: 82 QT Interval:  219 QTC Calculation: 332 R Axis:   99 Text Interpretation:  Sinus tachycardia Consider right atrial enlargement  LVH with secondary repolarization abnormality Nonspecific ST and T wave  abnormality Baseline wander Artifact No old tracing to compare Confirmed  by [facility]  MD,  Fara Worthy [phone]) on 10/28/2014 9:25:26 AM      EKG Interpretation  Date/Time:  Wednesday October 11 2014 11:33:53 EST  repeat Ventricular Rate:  134 PR Interval:  105 QRS Duration: 84 QT Interval:  295 QTC Calculation: 440 R Axis:   97 Text Interpretation:  Sinus tachycardia Consider right atrial enlargement Nonspecific ST and T wave abnormality Artifact Since last tracing of earlier today No significant change was found Confirmed by [facility]  MD, Nunzio Cory [phone]) on 10/13/2014 11:56:21 AM         MDM  MDM Reviewed: previous chart, nursing note and vitals Reviewed previous: labs and ECG Interpretation: labs, ECG and x-ray     Results for orders placed or performed during the hospital encounter of 10/10/2014  Comprehensive metabolic panel  Result Value Ref Range   Sodium 132 (L) 137 - 147 mEq/L   Potassium 4.7 3.7 - 5.3 mEq/L   Chloride 90 (L) 96 - 112 mEq/L   CO2 28 19 - 32 mEq/L   Glucose, Bld 98  70 - 99 mg/dL   BUN 21 6 - 23 mg/dL   Creatinine, Ser 0.48 (L) 0.50 - 1.10 mg/dL   Calcium 13.5 (HH) 8.4 - 10.5 mg/dL   Total Protein 9.1 (H) 6.0 - 8.3 g/dL   Albumin 2.5 (L) 3.5 - 5.2 g/dL   AST 47 (H) 0 - 37 U/L   ALT 16 0 - 35 U/L   Alkaline Phosphatase 98 39 - 117 U/L   Total Bilirubin 0.3 0.3 - 1.2 mg/dL   GFR calc non Af Amer >90 >90 mL/min   GFR calc Af Amer >90 >90 mL/min   Anion gap 14 5 - 15  Lipase, blood  Result Value Ref Range   Lipase 12 11 - 59 U/L  CBC with Differential  Result Value Ref Range   WBC 13.4 (H) 4.0 - 10.5 K/uL   RBC 3.82 (L) 3.87 - 5.11 MIL/uL   Hemoglobin 11.0 (L) 12.0 - 15.0 g/dL   HCT 32.7 (L) 36.0 - 46.0 %   MCV 85.6 78.0 - 100.0 fL   MCH 28.8 26.0 - 34.0 pg   MCHC 33.6 30.0 - 36.0 g/dL   RDW 16.8 (H) 11.5 - 15.5 %   Platelets 756 (H) 150 - 400 K/uL   Neutrophils Relative % 84 (H) 43 - 77 %   Neutro Abs 11.3 (H) 1.7 - 7.7 K/uL   Lymphocytes Relative 7 (L) 12 - 46 %   Lymphs Abs 1.0 0.7 - 4.0 K/uL   Monocytes Relative 9 3 - 12 %    Monocytes Absolute 1.2 (H) 0.1 - 1.0 K/uL   Eosinophils Relative 0 0 - 5 %   Eosinophils Absolute 0.1 0.0 - 0.7 K/uL   Basophils Relative 0 0 - 1 %   Basophils Absolute 0.0 0.0 - 0.1 K/uL  Lactic acid, plasma  Result Value Ref Range   Lactic Acid, Venous 1.8 0.5 - 2.2 mmol/L  Troponin I  Result Value Ref Range   Troponin I <0.30 <0.30 ng/mL  Urinalysis, Routine w reflex microscopic  Result Value Ref Range   Color, Urine YELLOW YELLOW   APPearance CLEAR CLEAR   Specific Gravity, Urine 1.013 1.005 - 1.030   pH 7.0 5.0 - 8.0   Glucose, UA NEGATIVE NEGATIVE mg/dL   Hgb urine dipstick NEGATIVE NEGATIVE   Bilirubin Urine NEGATIVE NEGATIVE   Ketones, ur NEGATIVE NEGATIVE mg/dL   Protein, ur NEGATIVE NEGATIVE mg/dL   Urobilinogen, UA 0.2 0.0 - 1.0 mg/dL   Nitrite NEGATIVE NEGATIVE   Leukocytes, UA NEGATIVE NEGATIVE   Dg Abd Acute W/chest 11/09/2014   CLINICAL DATA:  Diffuse abdominal pain and nausea. History of esophageal carcinoma.  EXAM: ACUTE ABDOMEN SERIES (ABDOMEN 2 VIEW & CHEST 1 VIEW)  COMPARISON:  None.  FINDINGS: Esophageal stent is in place. There is near complete white out of the left chest with volume loss. Small loculated pneumothorax is seen in the left lower lung zone. The right lung is clear.  Two views of the abdomen show a PEG tube in place which projects in good position. No free intraperitoneal air or evidence of bowel obstruction is identified. Large volume of stool in the rectosigmoid colon is noted.  IMPRESSION: No acute finding in the abdomen with a large volume of stool in the rectosigmoid colon.  Near complete white of the left chest compatible with effusion and atelectasis. There appears to be a small loculated left basilar pneumothorax.   Electronically Signed   By: Inge Rise M.D.  On: 10/16/2014 09:51    1150:   EPIC chart reviewed: pt's HR 137 in [place] ofc 2 weeks ago, c/w HR today. Pt with known "white out" on CXR per both of pt's daughters at bedside.  Pt's HR will decrease when she is laying down quiet (HR 120's), then will increase to 180's when she sits up and coughs vigorously. Pt denies specific CP/SOB, states she "just hurts all over like I always do." Will dose IV pain meds and continue IVF for clinical dehydration and hypercalcemia. ED RN pulled fluid from PEG, not coffee ground or frankly bloody, states she only saw "a few flecks of blood" in the gastric fluid. H/H is stable and abd remains benign on exam. Dx and testing d/w pt and family.  Questions answered.  Verb understanding, agreeable to admit. T/C to [facility] Dr. Francene Boyers, case discussed, including:  HPI, pertinent PM/SHx, VS/PE, dx testing, ED course and treatment:  Requests to [facility] at [facility] to admit. T/C to [facility] Resident at [facility], case discussed, including:  HPI, pertinent PM/SHx, VS/PE, dx testing, ED course and treatment:  Agreeable to admit, requests to write temporary orders, obtain tele bed to Dr. Rosario Jacks service.    Francine Graven, DO 10/13/14 [phone]

## 2014-10-11 NOTE — ED Notes (Signed)
Report given to Carlis Abbott, RN  End of assignment

## 2014-10-11 NOTE — Progress Notes (Signed)
Report received from [facility] at [facility] ED for patient to be admitted into [phone]

## 2014-10-12 ENCOUNTER — Telehealth: Payer: Self-pay | Admitting: Clinical

## 2014-10-12 DIAGNOSIS — C159 Malignant neoplasm of esophagus, unspecified: Secondary | ICD-10-CM | POA: Insufficient documentation

## 2014-10-12 DIAGNOSIS — K922 Gastrointestinal hemorrhage, unspecified: Secondary | ICD-10-CM | POA: Diagnosis present

## 2014-10-12 LAB — COMPREHENSIVE METABOLIC PANEL
ALT: 14 U/L (ref 0–35)
AST: 26 U/L (ref 0–37)
Albumin: 2.2 g/dL — ABNORMAL LOW (ref 3.5–5.2)
Alkaline Phosphatase: 92 U/L (ref 39–117)
Anion gap: 15 (ref 5–15)
BILIRUBIN TOTAL: 0.2 mg/dL — AB (ref 0.3–1.2)
BUN: 16 mg/dL (ref 6–23)
CHLORIDE: 100 meq/L (ref 96–112)
CO2: 25 meq/L (ref 19–32)
Calcium: 10.2 mg/dL (ref 8.4–10.5)
Creatinine, Ser: 0.44 mg/dL — ABNORMAL LOW (ref 0.50–1.10)
GFR calc non Af Amer: >90 mL/min (ref >90)
Glucose, Bld: 103 mg/dL — ABNORMAL HIGH (ref 70–99)
Potassium: 3.5 mEq/L — ABNORMAL LOW (ref 3.7–5.3)
Sodium: 140 mEq/L (ref 137–147)
Total Protein: 8 g/dL (ref 6.0–8.3)

## 2014-10-12 LAB — URINE CULTURE
Colony Count: NO GROWTH
Culture: NO GROWTH

## 2014-10-12 LAB — CBC
HCT: 27.8 % — ABNORMAL LOW (ref 36.0–46.0)
Hemoglobin: 9 g/dL — ABNORMAL LOW (ref 12.0–15.0)
MCH: 27.2 pg (ref 26.0–34.0)
MCHC: 32.4 g/dL (ref 30.0–36.0)
MCV: 84 fL (ref 78.0–100.0)
Platelets: 847 10*3/uL — ABNORMAL HIGH (ref 150–400)
RBC: 3.31 MIL/uL — ABNORMAL LOW (ref 3.87–5.11)
RDW: 16.8 % — AB (ref 11.5–15.5)
WBC: 13.1 10*3/uL — ABNORMAL HIGH (ref 4.0–10.5)

## 2014-10-12 MED ORDER — FOLIC ACID 1 MG PO TABS
1.0000 mg | ORAL_TABLET | Freq: Every day | ORAL | Status: DC
  Administered 2014-10-12 – 2014-10-15 (×4): 1 mg
  Filled 2014-10-12 (×4): qty 1

## 2014-10-12 MED ORDER — CETYLPYRIDINIUM CHLORIDE 0.05 % MT LIQD
7.0000 mL | Freq: Two times a day (BID) | OROMUCOSAL | Status: DC
  Administered 2014-10-12 (×2): 7 mL via OROMUCOSAL

## 2014-10-12 MED ORDER — VITAMIN B-12 100 MCG PO TABS
50.0000 ug | ORAL_TABLET | Freq: Every day | ORAL | Status: DC
  Administered 2014-10-12 – 2014-10-15 (×4): 50 ug
  Filled 2014-10-12 (×4): qty 1

## 2014-10-12 MED ORDER — FERROUS SULFATE 300 (60 FE) MG/5ML PO SYRP
300.0000 mg | ORAL_SOLUTION | Freq: Every day | ORAL | Status: DC
  Administered 2014-10-12 – 2014-10-15 (×4): 300 mg
  Filled 2014-10-12 (×4): qty 5

## 2014-10-12 MED ORDER — CHLORHEXIDINE GLUCONATE 0.12 % MT SOLN
15.0000 mL | Freq: Two times a day (BID) | OROMUCOSAL | Status: DC
  Administered 2014-10-12 – 2014-10-17 (×10): 15 mL via OROMUCOSAL
  Filled 2014-10-12 (×11): qty 15

## 2014-10-12 MED ORDER — OSMOLITE 1.5 CAL PO LIQD
237.0000 mL | Freq: Four times a day (QID) | ORAL | Status: DC
  Administered 2014-10-12 – 2014-10-15 (×9): 237 mL
  Filled 2014-10-12 (×22): qty 237

## 2014-10-12 NOTE — Consult Note (Signed)
Subjective:   HPI  The patient is a 29 sure old female with a diagnosis recently of esophageal carcinoma. She underwent radiation treatment at [facility] for this. She also had an esophageal stent placed temporarily but this was subsequently removed. She has a PEG tube in place. It was recommended by several physicians for history that she should he in hospice.  We are asked to see her in regards to coffee-ground-appearing material coming from the PEG tube. Her hemoglobin and hematocrit today were 9 and 27.8. 3 weeks ago her hemoglobin and hematocrit were 9.5 and 28. Yesterday her hemoglobin and hematocrit were 11 and 32.7 when she came in to the hospital but there is no report of any transfusion in the past few weeks. I suspect that the elevated hemoglobin and hematocrit yesterday from 3 weeks ago may have been related to her being somewhat dehydrated and the drop overnight perhaps from hydration. There is no report of any bright red blood.  There is a unilateral left large pleural effusion with white out  Review of Systems Denies chest pain  Past Medical History  Diagnosis Date  . Tachycardia   . Tracheoesophageal fistula   . Hypertension   . Aspiration pneumonia ~ 07/2014  . Squamous cell carcinoma of esophagus dx'd ~ 07/2014   Past Surgical History  Procedure Laterality Date  . Peg placement  07/18/14  . Tracheostomy  07/28/14   History   Social History  . Marital Status: Single    Spouse Name: N/A    Number of Children: N/A  . Years of Education: N/A   Occupational History  . Not on file.   Social History Main Topics  . Smoking status: Former Smoker -- 1.50 packs/day for 38 years    Types: Cigarettes    Start date: 11/10/1976    Quit date: 05/10/2014  . Smokeless tobacco: Never Used  . Alcohol Use: 0.0 oz/week    0 Not specified per week     Comment: History of alchohol abuse  . Drug Use: No  . Sexual Activity: Not on file   Other Topics  Concern  . Not on file   Social History Narrative   family history includes Alcoholism in her father; Asthma in her sister; Breast cancer in her sister; Hypertension in her mother and sister; Lung cancer in her brother. Current facility-administered medications: 0.9 %  sodium chloride infusion, , Intravenous, Continuous, Leone Haven, MD, Last Rate: 100 mL/hr at 10/12/14 0959;  acetaminophen (TYLENOL) tablet 650 mg, 650 mg, Oral, Q8H PRN, Leone Haven, MD;  amLODipine (NORVASC) tablet 10 mg, 10 mg, Per Tube, Daily, Leone Haven, MD, 10 mg at 10/12/14 0959 antiseptic oral rinse (CPC / CETYLPYRIDINIUM CHLORIDE 0.05%) solution 7 mL, 7 mL, Mouth Rinse, q12n4p, Willeen Niece, MD, 7 mL at 10/12/14 1200;  calcitonin (MIACALCIN) injection 150 Units, 4 Units/kg, Subcutaneous, Q12H, Leone Haven, MD, 150 Units at 10/12/14 [phone];  chlorhexidine (PERIDEX) 0.12 % solution 15 mL, 15 mL, Mouth Rinse, BID, Willeen Niece, MD, 15 mL at 10/12/14 0800 DULoxetine (CYMBALTA) DR capsule 30 mg, 30 mg, Oral, Daily, Leone Haven, MD, 30 mg at 10/12/14 7371;  feeding supplement (OSMOLITE 1.5 CAL) liquid 237 mL, 237 mL, Per Tube, QID, Dorann Ou, RD, 237 mL at 10/12/14 1400 ferrous sulfate 300 (60 FE) MG/5ML syrup 300 mg, 300 mg, Per Tube, Daily, 300 mg at 05/05/93 8546 **AND** folic acid (FOLVITE) tablet 1 mg, 1 mg,  Per Tube, Daily, 1 mg at 10/12/14 1244 **AND** vitamin B-12 (CYANOCOBALAMIN) tablet 50 mcg, 50 mcg, Per Tube, Daily, Willeen Niece, MD, 50 mcg at 10/12/14 1244;  metoprolol (LOPRESSOR) tablet 50 mg, 50 mg, Per Tube, BID, Leone Haven, MD, 50 mg at 10/12/14 0959 pantoprazole sodium (PROTONIX) 40 mg/20 mL oral suspension 40 mg, 40 mg, Per Tube, Daily, Leone Haven, MD, 40 mg at 10/12/14 0959;  sodium chloride 0.9 % injection 3 mL, 3 mL, Intravenous, Q12H, Leone Haven, MD, 3 mL at 10/12/14 6945;  thiamine (VITAMIN B-1) tablet 100 mg, 100 mg, Per Tube, Daily, Willeen Niece, MD,  100 mg at 10/12/14 0959 traMADol (ULTRAM) tablet 50 mg, 50 mg, Oral, Q8H PRN, Leone Haven, MD, 50 mg at 10/16/2014 2245 No Known Allergies   Objective:     BP 165/70 mmHg  Pulse 114  Temp(Src) 98.4 F (36.9 C) (Oral)  Resp 20  Ht 5\' 1"  (1.549 m)  Wt 37.558 kg (82 lb 12.8 oz)  BMI 15.65 kg/m2  SpO2 98%  Cachectic appearing female in no acute distress  Heart regular rhythm  Lungs with decreased breath sounds  Abdomen: Bowel sounds present, soft, nontender, no problems seen with PEG tube  Laboratory No components found for: D1    Assessment:     #1. Esophageal carcinoma  #2. Coffee-ground-appearing material seen via the PEG tube, but no significant change in hemoglobin or hematocrit over the last 3 weeks. I do not think we are dealing with any significant gastrointestinal bleeding.      Plan:     Supportive care. PPI therapy. I do not think endoscopy would be of any benefit at this time. Lab Results  Component Value Date   HGB 9.0* 10/12/2014   HGB 11.0* 10/20/2014   HGB 9.5* 09/18/2014   HCT 27.8* 10/12/2014   HCT 32.7* 11/08/2014   HCT 28* 09/18/2014   ALKPHOS 92 10/12/2014   ALKPHOS 98 10/10/2014   ALKPHOS 92 09/11/2014   AST 26 10/12/2014   AST 47* 10/16/2014   AST 20 09/11/2014   ALT 14 10/12/2014   ALT 16 10/20/2014   ALT 14 09/11/2014

## 2014-10-12 NOTE — Progress Notes (Addendum)
INITIAL NUTRITION ASSESSMENT  DOCUMENTATION CODES Per approved criteria  -Severe malnutrition in the context of chronic illness  Pt meets criteria for severe MALNUTRITION in the context of chronic illness as evidenced by severe fat and muscle wasting.  INTERVENTION: Initiate Osmolite 1.5 @ 237 ml QID. TF regimen to provide 1422 kcal, 59 g protein and 720 ml free water. TF will meet 100% of estimated calorie and protein needs.   Recommend free water flushes of 200 ml QID to provide additional 800 ml of free water (1520 ml per day with TF).   NUTRITION DIAGNOSIS: Inadequate oral intake related to inability to eat as evidenced by NPO.   Goal: Pt to meet >/= 90% of their estimated nutrition needs   Monitor:  Weight trend, TF initiation and management, labs  Reason for Assessment: MST and consult for TF initiation and management  58 y.o. female  Admitting Dx: GI bleed  ASSESSMENT: 58 y.o. female presenting with concern for blood in her PEG tube and hypercalcemia . PMH is significant for Esophageal Squamous Cell Carcinoma.  - Pt's weight is stable.  - Per RN, pt was receiving home TF regimen of Jevity 1.5- 237 ml QID which provides 1422 kcal, 60 g protein and 830 ml of free water.  [facility] does not carry pt's regular formula. Will use Osmolite 1.5.   Nutrition Focused Physical Exam:  Subcutaneous Fat:  Orbital Region: severe depletion Upper Arm Region: severe depletion Thoracic and Lumbar Region: severe depletion  Muscle:  Temple Region: severe depletion Clavicle Bone Region: severe depletion Clavicle and Acromion Bone Region: severe depletion Scapular Bone Region: severe depletion Dorsal Hand: severe depletion Patellar Region: severe depletion Anterior Thigh Region: severe depletion Posterior Calf Region: severe depletion  Edema: none  Labs: Na WNL K low BUN WNL Albumin low  Height: Ht Readings from Last 1 Encounters:  10/14/2014 5\' 1"  (1.549 m)     Weight: Wt Readings from Last 1 Encounters:  11/09/2014 82 lb 12.8 oz (37.558 kg)    Ideal Body Weight: 47.8 kg  % Ideal Body Weight: 79%  Wt Readings from Last 10 Encounters:  10/19/2014 82 lb 12.8 oz (37.558 kg)  09/27/14 82 lb 12.8 oz (37.558 kg)    Usual Body Weight: 82 lbs  % Usual Body Weight: 100%  BMI:  Body mass index is 15.65 kg/(m^2).  Estimated Nutritional Needs: Kcal: 1250-1450 Protein: 55-70 g Fluid: 1.5 L/day  Skin: Intact  Diet Order: Diet NPO time specified  EDUCATION NEEDS: -Education needs addressed   Intake/Output Summary (Last 24 hours) at 10/12/14 1301 Last data filed at 10/12/14 0659  Gross per 24 hour  Intake 3193.67 ml  Output      7 ml  Net 3186.67 ml    Last BM: prior to admission   Labs:   Recent Labs Lab 10/10/2014 1015 10/12/14 0616  NA 132* 140  K 4.7 3.5*  CL 90* 100  CO2 28 25  BUN 21 16  CREATININE 0.48* 0.44*  CALCIUM 13.5* 10.2  GLUCOSE 98 103*    CBG (last 3)  No results for input(s): GLUCAP in the last 72 hours.  Scheduled Meds: . amLODipine  10 mg Per Tube Daily  . antiseptic oral rinse  7 mL Mouth Rinse q12n4p  . calcitonin  4 Units/kg Subcutaneous Q12H  . chlorhexidine  15 mL Mouth Rinse BID  . DULoxetine  30 mg Oral Daily  . feeding supplement (JEVITY 1.2 CAL)  237 mL Per Tube QID  .  ferrous sulfate  300 mg Per Tube Daily   And  . folic acid  1 mg Per Tube Daily   And  . vitamin B-12  50 mcg Per Tube Daily  . metoprolol  50 mg Per Tube BID  . pantoprazole sodium  40 mg Per Tube Daily  . sodium chloride  3 mL Intravenous Q12H  . thiamine  100 mg Per Tube Daily    Continuous Infusions: . sodium chloride 100 mL/hr at 10/12/14 0211    Past Medical History  Diagnosis Date  . Tachycardia   . Tracheoesophageal fistula   . Hypertension   . Aspiration pneumonia ~ 07/2014  . Squamous cell carcinoma of esophagus dx'd ~ 07/2014    Past Surgical History  Procedure Laterality Date  . Peg  placement  07/18/14  . Tracheostomy  07/28/14    Laurette Schimke MS, RD, LDN

## 2014-10-12 NOTE — Plan of Care (Signed)
Problem: Phase I Progression Outcomes Goal: Voiding-avoid urinary catheter unless indicated Outcome: Completed/Met Date Met:  10/12/14

## 2014-10-12 NOTE — Progress Notes (Signed)
Utilization review completed.  

## 2014-10-12 NOTE — Telephone Encounter (Signed)
CSW received a returned call from the [place], [place], informing CSW that a Medicaid & Disability application could not be done with pt because currently she does have insurance. Ana informed CSW that she would make a referral to the [facility] who could complete these applications with pt. Either during her admission or once she discharges.  CSW informed pt's daughter of the above.Daughter appreciative as currently they are paying a high premium for pt's insurance. Pt currently has no income and is unable to work.  Hunt Oris, MSW, [place]

## 2014-10-12 NOTE — Progress Notes (Signed)
Family Medicine Teaching Service Daily Progress Note Intern Pager: [phone]  Patient name: Shelby Armstrong Medical record number: [id number] Date of birth: 02/09/1956 Age: 58 y.o. Gender: female  Primary Care Provider: Lupita Dawn, MD Consultants: GI Code Status: Full  Pt Overview and Major Events to Date:  12/2: admitted for blood in PEG tube  Assessment and Plan: # Hypercalcemia. Patient's calcium on admission corrects to 14.7. Patient has prior history per chart review (Ca ~11). Likely related to patient's malignancy. Patient also appears clinically dehydrated likely secondary to hypercalcemia induced diuresis, or potentially dehydration may be contributing to her hypercalcemia. Her hypercalcemia may also be contributing to patient's overall fatigue and dehydration. Patient currently has normal mental status. She does note diffuse pain and constipation was noted on abd XR, which could be related to elevated calcium. -NS @ 100cc/hr -Calcitonin and Lasix given 12/2 - Corrected Ca = 11.6 -Consider starting bisphosphonate therapy for long term control -Continue to monitor for mental status changes -Monitor on telemetry   # Possible GI bleed. HgB stable on admission (11.0). No bright red blood to suggest acute GI bleed. -f/u gastric occult blood -Protonix 40mg  daily - Hgb 9.0 (12/3) - GI consulted -- appreciate the assistance.  # Esophageal Cancer. Previously followed at [facility] oncology. Recently moved to [facility] for second opinion. Has appointment on 12/7 with onc in [place]. Has diffuse body pains and weakness related to cancer diagnosis. 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  -Home tylenol and tramadol prn pain -All nutrition through PEG tube -Nutrition consulted -NPO at this time - monitor respiratory status given chronic lung issues  # SIRS. Leukocytosis (13.4), tachycardia, and  tachypnea (22) on admission. Tachycardia chronic per patient and her family. No obvious source of infection. UA and CXR unremarkable. Lactic acid normal. Likely tachy and tachypneic relating to disease burden. - EKG shows sinus tachycardia - f/u urine culture - Initiate broad spectrum antibiotics and obtain blood cultures if patient deteriorates clinically.  - Continue to monitor  # Chronic Anemia. - Continue home iron supplementation  # HTN.  - continue home amlodipine and metoprolol  # Anxiety - continue home cymbalta  # Constipation: evident on imaging. Not a complaint of patient. - consider bowel regimen if no BM in am  # Right posterior thigh mass: concern would be for malignant process given history. Possibly a primary malignant process vs metastasis. - CT femur to assess  # Hyponatremia: stable from previous value 3 weeks ago. Potentially related to poor intake, though less likely with feedings through PEG tube. - IVF per above - BMET in am.   FEN/GI: NPO, Nutrition consulted for tube feedings, NS @200cc /hr Prophylaxis: SCDs until GI bleed is ruled out.   Disposition: home when deemed medically stable. Possibility for hospice or SNF if patient willing.  Subjective:  Patient continues to have some slight abdominal pain. No issues voiding or stooling. Mass on posterior Rt femur is painful. Denies confusion, chills, fever. Endorses some lightheadedness.  Objective: Temp:  [98.2 F (36.8 C)-100 F (37.8 C)] 98.4 F (36.9 C) (12/03 1406) Pulse Rate:  [111-140] 114 (12/03 1406) Resp:  [16-22] 20 (12/03 1406) BP: (121-165)/(64-85) 165/70 mmHg (12/03 1406) SpO2:  [98 %-100 %] 98 % (12/03 1406) Weight:  [82 lb 12.8 oz (37.558 kg)] 82 lb 12.8 oz (37.558 kg) (12/02 1454) Physical Exam: Gen: AAF lying in bed, appears older than stated age. Cachectic, chronically ill  appearing. HEENT: EOMI, PERRL. MMM Heart: Tachy but regular rhythm. Improved overall. Lungs: Right side  with good movement. Left side with hyper-resonant breath sounds  Abd: PEG in place without surrounding erythema. Scaphoid abdomen. Ext: Roughly 10 cm in diameter firm mass noted posterior Right thigh. Mildly TTP.  Laboratory:  Recent Labs Lab 10/22/2014 1015 10/12/14 0616  WBC 13.4* 13.1*  HGB 11.0* 9.0*  HCT 32.7* 27.8*  PLT 756* 847*    Recent Labs Lab 10/30/2014 1015 10/12/14 0616  NA 132* 140  K 4.7 3.5*  CL 90* 100  CO2 28 25  BUN 21 16  CREATININE 0.48* 0.44*  CALCIUM 13.5* 10.2  PROT 9.1* 8.0  BILITOT 0.3 0.2*  ALKPHOS 98 92  ALT 16 14  AST 47* 26  GLUCOSE 98 103*    Imaging/Diagnostic Tests: KUB 12/2 IMPRESSION: No acute finding in the abdomen with a large volume of stool in the rectosigmoid colon.  Near complete white of the left chest compatible with effusion and atelectasis. There appears to be a small loculated left basilar Pneumothorax.   Elberta Leatherwood, MD 10/12/2014, 2:19 PM PGY-1, [place] Intern pager: [phone], text pages welcome

## 2014-10-12 NOTE — Progress Notes (Signed)
[facility] [id number] [place] [address] Phone: [phone] Fax: [phone]  October 12, 2014  Patient: Shelby Armstrong  Date of Birth: [birth year]  Date of Visit: 10/22/2014    To Whom It May Concern:  Please excuse the daughter of our patient, L. Wolven, from work yesterday (10/18/2014) due to the necessity of being with her mother at the time of her continued admission into the hospital.   Sincerely,    Elberta Leatherwood, MD,MS 10/12/2014 9:23 AM

## 2014-10-13 ENCOUNTER — Inpatient Hospital Stay (HOSPITAL_COMMUNITY): Payer: BC Managed Care – PPO

## 2014-10-13 DIAGNOSIS — I480 Paroxysmal atrial fibrillation: Secondary | ICD-10-CM

## 2014-10-13 DIAGNOSIS — M7989 Other specified soft tissue disorders: Secondary | ICD-10-CM | POA: Diagnosis present

## 2014-10-13 DIAGNOSIS — M799 Soft tissue disorder, unspecified: Secondary | ICD-10-CM

## 2014-10-13 LAB — BASIC METABOLIC PANEL
ANION GAP: 15 (ref 5–15)
BUN: 12 mg/dL (ref 6–23)
CALCIUM: 10.5 mg/dL (ref 8.4–10.5)
CO2: 24 mEq/L (ref 19–32)
CREATININE: 0.39 mg/dL — AB (ref 0.50–1.10)
Chloride: 101 mEq/L (ref 96–112)
GFR calc Af Amer: >90 mL/min (ref >90)
Glucose, Bld: 81 mg/dL (ref 70–99)
Potassium: 3.6 mEq/L — ABNORMAL LOW (ref 3.7–5.3)
SODIUM: 140 meq/L (ref 137–147)

## 2014-10-13 LAB — CBC
HCT: 30.4 % — ABNORMAL LOW (ref 36.0–46.0)
Hemoglobin: 9.7 g/dL — ABNORMAL LOW (ref 12.0–15.0)
MCH: 26.9 pg (ref 26.0–34.0)
MCHC: 31.9 g/dL (ref 30.0–36.0)
MCV: 84.2 fL (ref 78.0–100.0)
PLATELETS: 877 10*3/uL — AB (ref 150–400)
RBC: 3.61 MIL/uL — ABNORMAL LOW (ref 3.87–5.11)
RDW: 16.7 % — AB (ref 11.5–15.5)
WBC: 13.7 10*3/uL — ABNORMAL HIGH (ref 4.0–10.5)

## 2014-10-13 LAB — MRSA PCR SCREENING: MRSA by PCR: NEGATIVE

## 2014-10-13 LAB — TROPONIN I: Troponin I: <0.3 ng/mL (ref <0.30)

## 2014-10-13 LAB — TSH: TSH: 0.547 u[IU]/mL (ref 0.350–4.500)

## 2014-10-13 LAB — GLUCOSE, CAPILLARY: GLUCOSE-CAPILLARY: 82 mg/dL (ref 70–99)

## 2014-10-13 MED ORDER — DILTIAZEM LOAD VIA INFUSION
10.0000 mg | Freq: Once | INTRAVENOUS | Status: AC
  Administered 2014-10-13: 5 mg via INTRAVENOUS
  Administered 2014-10-13: 10 mg via INTRAVENOUS
  Filled 2014-10-13: qty 10

## 2014-10-13 MED ORDER — FUROSEMIDE 10 MG/ML IJ SOLN
INTRAMUSCULAR | Status: AC
Start: 2014-10-13 — End: 2014-10-14
  Filled 2014-10-13: qty 2

## 2014-10-13 MED ORDER — CETYLPYRIDINIUM CHLORIDE 0.05 % MT LIQD
7.0000 mL | Freq: Two times a day (BID) | OROMUCOSAL | Status: DC
  Administered 2014-10-13 – 2014-10-17 (×8): 7 mL via OROMUCOSAL

## 2014-10-13 MED ORDER — IPRATROPIUM-ALBUTEROL 0.5-2.5 (3) MG/3ML IN SOLN: 3.0000 mL | RESPIRATORY_TRACT | Status: DC

## 2014-10-13 MED ORDER — FUROSEMIDE 10 MG/ML IJ SOLN
20.0000 mg | Freq: Once | INTRAMUSCULAR | Status: AC
  Administered 2014-10-13: 20 mg via INTRAVENOUS

## 2014-10-13 MED ORDER — DILTIAZEM HCL 100 MG IV SOLR
5.0000 mg/h | INTRAVENOUS | Status: DC
  Administered 2014-10-13 – 2014-10-15 (×4): 10 mg/h via INTRAVENOUS
  Administered 2014-10-15 (×3): 20 mg/h via INTRAVENOUS
  Administered 2014-10-16: 15 mg/h via INTRAVENOUS
  Administered 2014-10-16 (×2): 20 mg/h via INTRAVENOUS
  Administered 2014-10-16: 15 mg/h via INTRAVENOUS
  Filled 2014-10-13: qty 100

## 2014-10-13 NOTE — Significant Event (Signed)
Rapid Response Event Note  Overview: Time Called: 1201 Arrival Time: 1208 Event Type: Cardiac  Initial Focused Assessment:  Called by RN to evaluate patient with an abnormal EKG.  Upon my arrival to patients room  Rn and family at bedside.  Patient is lying in bed having some issues with n/v.  Denies CP or SOB at this time. Patient states she intermitenly has some pain.  She pointed to the center of her chest and abd when asked where pain was.     Interventions:  MD at bedside, VSS, EKG to be repeated.  Labs ordered.     Event Summary:  RN to call if assistance needed   at      at          [facility], Harlin Rain

## 2014-10-13 NOTE — Progress Notes (Signed)
[facility] Progress Note  Subjective: No signs of active GI bleeding  Objective: Vital signs in last 24 hours: Temp:  [98.1 F (36.7 C)-98.7 F (37.1 C)] 98.7 F (37.1 C) (12/04 0505) Pulse Rate:  [113-125] 122 (12/04 0914) Resp:  [20] 20 (12/04 0505) BP: (125-165)/(55-73) 125/55 mmHg (12/04 0914) SpO2:  [98 %-100 %] 99 % (12/04 0914) Weight change:    PE:  She is in no distress  Hemoglobin and hematocrit stable  Lab Results: Results for orders placed or performed during the hospital encounter of 10/29/2014 (from the past 24 hour(s))  CBC     Status: Abnormal   Collection Time: 10/13/14  6:32 AM  Result Value Ref Range   WBC 13.7 (H) 4.0 - 10.5 K/uL   RBC 3.61 (L) 3.87 - 5.11 MIL/uL   Hemoglobin 9.7 (L) 12.0 - 15.0 g/dL   HCT 30.4 (L) 36.0 - 46.0 %   MCV 84.2 78.0 - 100.0 fL   MCH 26.9 26.0 - 34.0 pg   MCHC 31.9 30.0 - 36.0 g/dL   RDW 16.7 (H) 11.5 - 15.5 %   Platelets 877 (H) 150 - 400 K/uL  Basic metabolic panel     Status: Abnormal   Collection Time: 10/13/14  6:32 AM  Result Value Ref Range   Sodium 140 137 - 147 mEq/L   Potassium 3.6 (L) 3.7 - 5.3 mEq/L   Chloride 101 96 - 112 mEq/L   CO2 24 19 - 32 mEq/L   Glucose, Bld 81 70 - 99 mg/dL   BUN 12 6 - 23 mg/dL   Creatinine, Ser 0.39 (L) 0.50 - 1.10 mg/dL   Calcium 10.5 8.4 - 10.5 mg/dL   GFR calc non Af Amer >90 >90 mL/min   GFR calc Af Amer >90 >90 mL/min   Anion gap 15 5 - 15    Studies/Results: No results found.    Assessment: Esophageal carcinoma  PEG tube  Coffee-ground-appearing material seen from PEG tube not a clinically significant bleed  Plan: Continue PPI therapy. No intervention planned. We will sign off.    Brisia Schuermann F 10/13/2014, 11:19 AM

## 2014-10-13 NOTE — Progress Notes (Signed)
S: Called to patient's room. Patient complaining of wheezing and shortness of breath. Also with productive cough since this morning. No fevers or chills. Also some reports of orthopnea during MRI earlier today.   O: Blood pressure 148/74, pulse 105, temperature 98.6 F (37 C), temperature source Oral, resp. rate 16, height 5\' 1"  (1.549 m), weight 82 lb 12.8 oz (37.558 kg), SpO2 100 % on 2L [place]. Gen: NAD, lying in bed, Hale in place Pulm: NWOB. Upper airway sounds transmitted. No wheezes or crackles appreciated. Good air movement in right lung Ext: No edema or cyanosis  A/P: Patient has been receiving 100-200cc/hr of IVF since admission, thus some concern for volume overload, though patient does not appear overloaded on exam. Will give 1 dose of lasix and KVO IVF. Will also get CXR to r/o developing infectious process or volume overload.   Shelby Armstrong. Jerline Pain, [place] Resident PGY-1 10/13/2014 11:10 PM

## 2014-10-13 NOTE — Progress Notes (Signed)
Family Medicine Teaching Service Daily Progress Note Intern Pager: [phone]  Patient name: Shelby Armstrong Medical record number: [id number] Date of birth: 12-04-1955 Age: 58 y.o. Gender: female  Primary Care Provider: Lupita Dawn, MD Consultants: GI Code Status: Full  Pt Overview and Major Events to Date:  12/2: admitted for blood in PEG tube  Assessment and Plan: # Hypercalcemia. Patient's calcium on admission corrects to 14.7. Patient has prior history per chart review (Ca ~11). Likely related to patient's malignancy. Patient also appears clinically dehydrated likely secondary to hypercalcemia induced diuresis, or potentially dehydration may be contributing to her hypercalcemia. Her hypercalcemia may also be contributing to patient's overall fatigue and dehydration. Patient currently has normal mental status. She does note diffuse pain and constipation was noted on abd XR, which could be related to elevated calcium. -NS @ 100cc/hr -Calcitonin and Lasix given 12/2 - Corrected Ca = 11.6 -Consider starting bisphosphonate therapy for long term control -Continue to monitor for mental status changes -Monitor on telemetry   #New Onset A-Fib - EKG ordered - amlodipine and metoprolol medications given - will discuss with patient and family about the desire to pursue aggressive work up  # Possible GI bleed. HgB stable on admission (11.0). No bright red blood to suggest acute GI bleed. -f/u gastric occult blood -Protonix 40mg  daily - Hgb 9.0 (12/3) - GI consulted -- appreciate the assistance.  # Esophageal Cancer. Previously followed at [facility] oncology. Recently moved to [facility] for second opinion. Has appointment on 12/7 with onc in [place]. Has diffuse body pains and weakness related to cancer diagnosis. 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  -Home tylenol and tramadol prn pain -All nutrition  through PEG tube -Nutrition consulted -NPO at this time - monitor respiratory status given chronic lung issues  # SIRS. Leukocytosis (13.4), tachycardia, and tachypnea (22) on admission. Tachycardia chronic per patient and her family. No obvious source of infection. UA and CXR unremarkable. Lactic acid normal. Likely tachy and tachypneic relating to disease burden. - EKG shows sinus tachycardia - f/u urine culture - Initiate broad spectrum antibiotics and obtain blood cultures if patient deteriorates clinically.  - Continue to monitor  # Chronic Anemia. - Continue home iron supplementation  # HTN.  - continue home amlodipine and metoprolol  # Anxiety - continue home cymbalta  # Constipation: evident on imaging. Not a complaint of patient. - consider bowel regimen if no BM in am  # Right posterior thigh mass: concern would be for malignant process given history. Possibly a primary malignant process vs metastasis. - CT femur to assess  # Hyponatremia: stable from previous value 3 weeks ago. Potentially related to poor intake, though less likely with feedings through PEG tube. - IVF per above - BMET in am.   FEN/GI: NPO, Nutrition consulted for tube feedings, NS @200cc /hr Prophylaxis: SCDs until GI bleed is ruled out.   Disposition: home when deemed medically stable. Possibility for hospice or SNF if patient willing.  Subjective:  Patient continues to have some slight abdominal pain. No issues voiding or stooling. Mass on posterior Rt femur is painful. Denies confusion, chills, fever. Endorses some lightheadedness.  Objective: Temp:  [98.1 F (36.7 C)-98.7 F (37.1 C)] 98.7 F (37.1 C) (12/04 0505) Pulse Rate:  [113-125] 113 (12/04 0505) Resp:  [20] 20 (12/04 0505) BP: (121-165)/(64-73) 130/73 mmHg (12/04 0505) SpO2:  [98 %-100 %] 100 % (12/04 0505) Physical Exam: Gen:  AAF lying in bed, appears older than stated age. Cachectic, chronically ill appearing. HEENT: EOMI,  PERRL. MMM Heart: irr irr. Slight blowing murmur noted at mitral.  Lungs: Right side with good movement. Left side with hyper-resonant breath sounds  Abd: PEG in place without surrounding erythema. Scaphoid abdomen. Ext: Roughly 10 cm in diameter firm mass noted posterior Right thigh. Mildly TTP.  Laboratory:  Recent Labs Lab 11/01/2014 1015 10/12/14 0616 10/13/14 0632  WBC 13.4* 13.1* 13.7*  HGB 11.0* 9.0* 9.7*  HCT 32.7* 27.8* 30.4*  PLT 756* 847* 877*    Recent Labs Lab 10/26/2014 1015 10/12/14 0616 10/13/14 0632  NA 132* 140 140  K 4.7 3.5* 3.6*  CL 90* 100 101  CO2 28 25 24   BUN 21 16 12   CREATININE 0.48* 0.44* 0.39*  CALCIUM 13.5* 10.2 10.5  PROT 9.1* 8.0  --   BILITOT 0.3 0.2*  --   ALKPHOS 98 92  --   ALT 16 14  --   AST 47* 26  --   GLUCOSE 98 103* 81    Imaging/Diagnostic Tests: KUB 12/2 IMPRESSION: No acute finding in the abdomen with a large volume of stool in the rectosigmoid colon.  Near complete white of the left chest compatible with effusion and atelectasis. There appears to be a small loculated left basilar Pneumothorax.   Elberta Leatherwood, MD 10/13/2014, 9:10 AM PGY-1, [place] Intern pager: [phone], text pages welcome

## 2014-10-13 NOTE — Progress Notes (Signed)
HR 194, AFib. MD paged. Orders received to given scheduled amlodipine and metoprolol now. Patient on cardiac monitor. Pt asymptomatic

## 2014-10-13 NOTE — Progress Notes (Signed)
Patient's HR rapid Afib >190. Dr. Harrington Challenger and Rapid Response called to bedside. Orders received. Cycling vitals. Dr. Harrington Challenger order to transfer patient to [facility] stepdown. Report called and given to Kenney Houseman, RN on [facility] for transfer.

## 2014-10-13 NOTE — Significant Event (Signed)
Rapid Response Event Note  Overview: Time Called: 1528 Arrival Time: 1531 Event Type: Cardiac  Initial Focused Assessment:  Called to patients bedside for HR in the 190's.  Upon my arrival to patients room RN and Dr. Harrington Challenger at bedside.  BP ok, see flowsheet, Patient states she feels very hot and feels her heart racing.     Interventions:  AS per Dr. Harrington Challenger, Cardizem drip started at 1541 with a bolus of 5 mg then a rate of 5 mg/hr.  At 1555 Cardizem drip increased to 10 mg/hr as per Dr. Harrington Challenger.  Pateint to be transferred to 2 H15   Event Summary:   at      at          [facility]

## 2014-10-13 NOTE — Progress Notes (Addendum)
Family Medicine Service MD updated on recent patient events and aware of transfer to [place].

## 2014-10-13 NOTE — Consult Note (Addendum)
Primary Physician: Primary Cardiologist:  New    [id number] is a 58 yo who we are asked to see re abnormal EKG Patient has a history of esophageal cnacer  Treated at [facility] with XRT for reported palliation.  Came to [facility] for pain  Found to be hypercalcemic.  Admitted for Rx  Today HR reported at 194  Afib  EKG done    Patient is not a good historian Came to [place] a few wks ago  Relative with her   Not sure if had heart history in past. She has had some CP  She says pain is worse with big breath  It has eaased  Appears to be worse when HR fast.     Past Medical History  Diagnosis Date  . Tachycardia   . Tracheoesophageal fistula   . Hypertension   . Aspiration pneumonia ~ 07/2014  . Squamous cell carcinoma of esophagus dx'd ~ 07/2014    Medications Prior to Admission  Medication Sig Dispense Refill  . acetaminophen (TYLENOL) 325 MG tablet Take 650 mg by mouth every 8 (eight) hours as needed for mild pain.     Marland Kitchen amLODipine (NORVASC) 10 MG tablet 10 mg by PEG Tube route daily.     Marland Kitchen aspirin 325 MG EC tablet Take 325 mg by mouth daily.    . DULoxetine (CYMBALTA) 30 MG capsule Take 30 mg by mouth daily.    Marland Kitchen esomeprazole (NEXIUM) 40 MG packet Take 40 mg by mouth daily before breakfast.    . Iron Dextran-Folic LSLH-T34 [id number] MG-MCG/5ML LIQD 100 mcg by PEG Tube route daily.    . metoprolol (LOPRESSOR) 50 MG tablet 50 mg by PEG Tube route 2 (two) times daily.     . Multiple Vitamin (MULTIVITAMIN) tablet Take 1 tablet by mouth daily.    . Nutritional Supplements (FEEDING SUPPLEMENT, JEVITY 1.5 CAL,) LIQD Place 1,000 mLs into feeding tube 4 (four) times daily.    Marland Kitchen thiamine 100 MG tablet 100 mg by PEG Tube route daily.     . traMADol (ULTRAM) 50 MG tablet Take 1 tablet (50 mg total) by mouth every 8 (eight) hours as needed. (Patient taking differently: Take 50 mg by mouth every 8 (eight) hours as needed for moderate pain. ) 30 tablet 0     . amLODipine  10 mg Per Tube Daily  .  antiseptic oral rinse  7 mL Mouth Rinse q12n4p  . calcitonin  4 Units/kg Subcutaneous Q12H  . chlorhexidine  15 mL Mouth Rinse BID  . DULoxetine  30 mg Oral Daily  . feeding supplement (OSMOLITE 1.5 CAL)  237 mL Per Tube QID  . ferrous sulfate  300 mg Per Tube Daily   And  . folic acid  1 mg Per Tube Daily   And  . vitamin B-12  50 mcg Per Tube Daily  . metoprolol  50 mg Per Tube BID  . pantoprazole sodium  40 mg Per Tube Daily  . sodium chloride  3 mL Intravenous Q12H  . thiamine  100 mg Per Tube Daily    Infusions: . sodium chloride 100 mL/hr at 10/12/14 2010    No Known Allergies  History   Social History  . Marital Status: Single    Spouse Name: N/A    Number of Children: N/A  . Years of Education: N/A   Occupational History  . Not on file.   Social History Main Topics  . Smoking status: Former Smoker -- 1.50 packs/day  for 38 years    Types: Cigarettes    Start date: 11/10/1976    Quit date: 05/10/2014  . Smokeless tobacco: Never Used  . Alcohol Use: 0.0 oz/week    0 Not specified per week     Comment: History of alchohol abuse  . Drug Use: No  . Sexual Activity: Not on file   Other Topics Concern  . Not on file   Social History Narrative    Family History  Problem Relation Age of Onset  . Hypertension Mother   . Alcoholism Father   . Asthma Sister   . Breast cancer Sister   . Hypertension Sister   . Lung cancer Brother     REVIEW OF SYSTEMS:  All systems reviewed  Negative to the above problem except as noted above.    PHYSICAL EXAM: Filed Vitals:   10/13/14 1308  BP: 129/79  Pulse: 110  Temp: 97.7 F (36.5 C)  Resp: 19     Intake/Output Summary (Last 24 hours) at 10/13/14 1457 Last data filed at 10/13/14 1018  Gross per 24 hour  Intake    257 ml  Output     75 ml  Net    182 ml    General:  Thin 58 yo in NAD   No respiratory difficulty HEENT: normal Neck: supple. no JVD. Cor: PMI nondisplaced. Regular rate & rhythm.III/VI  systolic Murmur LUSB  No S3   Lungs: Rhonchi on L   AbdomenScaphoid  Mild tenderness RUQ Extremities: no cyanosis, clubbing, rash, edema Neuro: Awake  Answers questions with mumbles    ECG:  12/2  ST 134 rSR' V1  Sl ST elevation with T wave inversion  V4-V6  12/4   12:24  ST 108  ST elev T wave inverison V2-V5, III, Sl in II, AVF  More pronounced than yesterday     Results for orders placed or performed during the hospital encounter of 10/16/2014 (from the past 24 hour(s))  CBC     Status: Abnormal   Collection Time: 10/13/14  6:32 AM  Result Value Ref Range   WBC 13.7 (H) 4.0 - 10.5 K/uL   RBC 3.61 (L) 3.87 - 5.11 MIL/uL   Hemoglobin 9.7 (L) 12.0 - 15.0 g/dL   HCT 30.4 (L) 36.0 - 46.0 %   MCV 84.2 78.0 - 100.0 fL   MCH 26.9 26.0 - 34.0 pg   MCHC 31.9 30.0 - 36.0 g/dL   RDW 16.7 (H) 11.5 - 15.5 %   Platelets 877 (H) 150 - 400 K/uL  Basic metabolic panel     Status: Abnormal   Collection Time: 10/13/14  6:32 AM  Result Value Ref Range   Sodium 140 137 - 147 mEq/L   Potassium 3.6 (L) 3.7 - 5.3 mEq/L   Chloride 101 96 - 112 mEq/L   CO2 24 19 - 32 mEq/L   Glucose, Bld 81 70 - 99 mg/dL   BUN 12 6 - 23 mg/dL   Creatinine, Ser 0.39 (L) 0.50 - 1.10 mg/dL   Calcium 10.5 8.4 - 10.5 mg/dL   GFR calc non Af Amer >90 >90 mL/min   GFR calc Af Amer >90 >90 mL/min   Anion gap 15 5 - 15  Troponin I     Status: None   Collection Time: 10/13/14 12:35 PM  Result Value Ref Range   Troponin I <0.30 <0.30 ng/mL   No results found. Abd and CXR  10/16/2014  EXAM: ACUTE ABDOMEN SERIES (ABDOMEN 2  VIEW & CHEST 1 VIEW)  COMPARISON: None.  FINDINGS: Esophageal stent is in place. There is near complete white out of the left chest with volume loss. Small loculated pneumothorax is seen in the left lower lung zone. The right lung is clear.  Two views of the abdomen show a PEG tube in place which projects in good position. No free intraperitoneal air or evidence of bowel obstruction is  identified. Large volume of stool in the rectosigmoid colon is noted.  IMPRESSION: No acute finding in the abdomen with a large volume of stool in the rectosigmoid colon.  Near complete white of the left chest compatible with effusion and atelectasis. There appears to be a small loculated left basilar pneumothorax.   ASSESSMENT:  Patient is an unfortunate 58 yo who was admitted wht electrolyte abnormaliies, failure to thrive  Asked to see re abnormal EKG  On tele patient has been in a persistent ST for past 12 hours at least  Does have intermitt afib with this with rates up to 200   Has Cp when her heart is really beating fast  Pleuritic  On exam pt thin, appears volume depleted.  II/Vi systolic murmur LSB  Rhonchi  EKG changes as noted Concerning for ischemia/injury.    REcomm Need to control HR   I would d/c amlodiipine and start diltiazem IV  Titrate to HR/BP   Would continue to hydrate Echo once HR slows   Cycle enzymes.   Limited Rx because of other medical issues  Would not anticoagulate with concern of bleeding    Patient and family unclear about prior heart history.    2.  GI  Patient with esophageal CA  Possible GI bleed  GI service is following  3.   Anemia  4/  HTN  Follow on meds       Shelby Armstrong

## 2014-10-13 NOTE — Progress Notes (Addendum)
MD notified about about EKG results. Patient denies any chest pain at this time, but complaints of intermittent pain. Rapid Response RN notified. MD aware and to come to bedside.   12:11 PM Rapid Response RN at bedside. MD at bedside.   12:28 PM Second EKG completed. Dr. Lindell Noe aware and at bedside. Patient placed on 2L of oxygen for comfort.

## 2014-10-14 ENCOUNTER — Inpatient Hospital Stay (HOSPITAL_COMMUNITY): Payer: BC Managed Care – PPO

## 2014-10-14 DIAGNOSIS — I369 Nonrheumatic tricuspid valve disorder, unspecified: Secondary | ICD-10-CM

## 2014-10-14 LAB — BASIC METABOLIC PANEL
Anion gap: 14 (ref 5–15)
BUN: 13 mg/dL (ref 6–23)
CALCIUM: 10.8 mg/dL — AB (ref 8.4–10.5)
CO2: 26 meq/L (ref 19–32)
Chloride: 98 mEq/L (ref 96–112)
Creatinine, Ser: 0.39 mg/dL — ABNORMAL LOW (ref 0.50–1.10)
GFR calc Af Amer: >90 mL/min (ref >90)
GFR calc non Af Amer: >90 mL/min (ref >90)
GLUCOSE: 97 mg/dL (ref 70–99)
Potassium: 3.4 mEq/L — ABNORMAL LOW (ref 3.7–5.3)
SODIUM: 138 meq/L (ref 137–147)

## 2014-10-14 LAB — CBC
HEMATOCRIT: 27.6 % — AB (ref 36.0–46.0)
HEMOGLOBIN: 9.7 g/dL — AB (ref 12.0–15.0)
MCH: 30.8 pg (ref 26.0–34.0)
MCHC: 35.1 g/dL (ref 30.0–36.0)
MCV: 87.6 fL (ref 78.0–100.0)
Platelets: 874 10*3/uL — ABNORMAL HIGH (ref 150–400)
RBC: 3.15 MIL/uL — AB (ref 3.87–5.11)
RDW: 16.7 % — ABNORMAL HIGH (ref 11.5–15.5)
WBC: 14.6 10*3/uL — ABNORMAL HIGH (ref 4.0–10.5)

## 2014-10-14 LAB — GLUCOSE, CAPILLARY
GLUCOSE-CAPILLARY: 114 mg/dL — AB (ref 70–99)
Glucose-Capillary: 181 mg/dL — ABNORMAL HIGH (ref 70–99)

## 2014-10-14 MED ORDER — LORAZEPAM 2 MG/ML IJ SOLN
1.0000 mg | Freq: Once | INTRAMUSCULAR | Status: AC
  Administered 2014-10-14: 1 mg via INTRAVENOUS

## 2014-10-14 MED ORDER — POTASSIUM CHLORIDE 20 MEQ/15ML (10%) PO SOLN
20.0000 meq | Freq: Every day | ORAL | Status: DC
  Administered 2014-10-14 – 2014-10-17 (×4): 20 meq via ORAL
  Filled 2014-10-14 (×3): qty 15

## 2014-10-14 MED ORDER — GADOBENATE DIMEGLUMINE 529 MG/ML IV SOLN
8.0000 mL | Freq: Once | INTRAVENOUS | Status: AC | PRN
  Administered 2014-10-14: 8 mL via INTRAVENOUS

## 2014-10-14 MED ORDER — POTASSIUM CHLORIDE CRYS ER 20 MEQ PO TBCR: 20.0000 meq | EXTENDED_RELEASE_TABLET | Freq: Every day | ORAL | Status: DC

## 2014-10-14 NOTE — Progress Notes (Signed)
Order for Ativan 1mg  IV  received from Dr Alease Frame  To be given  just prior to MRI

## 2014-10-14 NOTE — Progress Notes (Addendum)
    Subjective:  Abd pain and chest pain mild (likely cancer related).   Objective:  Vital Signs in the last 24 hours: Temp:  [97.7 F (36.5 C)-99.4 F (37.4 C)] 99.4 F (37.4 C) (12/05 0700) Pulse Rate:  [90-188] 100 (12/05 0800) Resp:  [16-25] 21 (12/05 0800) BP: (88-154)/(53-94) 130/71 mmHg (12/05 0800) SpO2:  [95 %-100 %] 100 % (12/05 0800)  Intake/Output from previous day: 12/04 0701 - 12/05 0700 In: 2436 [P.O.:1; I.V.:1841; NG/GT:237] Out: 1775 [Urine:1775]   Physical Exam: General: Thin, ill appearing, in no acute distress. Head:  Normocephalic and atraumatic. Lungs: Clear to auscultation and percussion. Heart: Normal S1 and S2.  2/6 S murmur, no rubs or gallops.  Abdomen: PEG soft, non-tender, thin,  positive bowel sounds. Extremities: No clubbing or cyanosis. No edema. Neurologic: Alert and oriented x 3.    Lab Results:  Recent Labs  10/13/14 0632 10/14/14 0327  WBC 13.7* 14.6*  HGB 9.7* 9.7*  PLT 877* 874*    Recent Labs  10/13/14 0632 10/14/14 0327  NA 140 138  K 3.6* 3.4*  CL 101 98  CO2 24 26  GLUCOSE 81 97  BUN 12 13  CREATININE 0.39* 0.39*    Recent Labs  10/13/14 1235  TROPONINI <0.30   Hepatic Function Panel  Recent Labs  10/12/14 0616  PROT 8.0  ALBUMIN 2.2*  AST 26  ALT 14  ALKPHOS 92  BILITOT 0.2*      Imaging: Dg Chest Port 1 View  10/14/2014   CLINICAL DATA:  Acute onset of wheezing and shortness of breath today. Initial encounter.  EXAM: PORTABLE CHEST - 1 VIEW  COMPARISON:  Chest radiograph performed 10/10/2014  FINDINGS: There is persistent complete opacification of the left hemithorax, reflecting a combination of pleural effusion and atelectasis. Leftward mediastinal shift is again seen. Mild vascular congestion is noted at the right lung, with mild right basilar atelectasis. No pneumothorax is seen.  An esophageal stent is noted. The cardiomediastinal silhouette is not well assessed due to opacification of the  left hemithorax. No acute osseous abnormalities are identified. There is elevation of the left hemidiaphragm, with underlying air in the stomach and splenic flexure of the colon.  IMPRESSION: 1. Stable complete opacification of the left hemithorax, reflecting a combination of pleural effusion and atelectasis. 2. Mild vascular congestion at the right lung, with mild right basilar atelectasis.   Electronically Signed   By: Garald Balding M.D.   On: 10/14/2014 04:34   Telemetry: Sinus tach 100 Personally viewed.   Assessment/Plan:   58 year old with esophageal cancer, XRT at [facility] for palliation, with electrolyte abnormalities (hypercalcemic) with recent PAF with RVR (up to 194)  AFIB with RVR  - improved on IV dilt, now in NSR.   - If maintains NSR today would convert to PO diltiazem.   - not anticoagulation candidate  - on metoprolol 50 bid as well  - exacerbated by underlying condition  Esophageal Cancer  - GI service following  Hypercalcemia  - secondary to squamous cell CA  - Lasix and calcitonin per primary team  Will follow. Unfortunate situation. Palliation.    Trevion Hoben, [place] 10/14/2014, 10:58 AM

## 2014-10-14 NOTE — Progress Notes (Signed)
  Echocardiogram 2D Echocardiogram has been performed.  Foster, Shelby Armstrong 12/04/2013, 10:45 AM 

## 2014-10-14 NOTE — Progress Notes (Signed)
Received a page from nursing staff. Patient was down at MRI and became agitated. I placed an order for Ativan to be provided. Patient received 2 mg of Ativan. Continued to have agitation. Was called by nursing staff are asking for additional sedation. I did not feel comfortable at that time providing additional sedation considering patient's current body habitus and health status. I advised that if patient was unable to be calm for imaging under the current circumstances then we would have to wait until tomorrow to retry the exam or cancel the exam altogether after discussing with treatment team.  Nurse stated her understanding and passed this message onto the radiological team. It appears that imaging may still go on however, as the radiological technician informed the nurse that the images may be adequate at this time.

## 2014-10-14 NOTE — Progress Notes (Signed)
Family Medicine Teaching Service Daily Progress Note Intern Pager: [phone]  Patient name: Shelby Armstrong Medical record number: [id number] Date of birth: [birth year] Age: 58 y.o. Gender: female  Primary Care Provider: Lupita Dawn, MD Consultants: GI Code Status: Full  Pt Overview and Major Events to Date:  12/2: admitted for blood in PEG tube  Assessment and Plan: # Hypercalcemia. Patient's calcium on admission corrects to 14.7. Patient has prior history per chart review (Ca ~11). Likely related to patient's malignancy. Patient also appears clinically dehydrated likely secondary to hypercalcemia induced diuresis, or potentially dehydration may be contributing to her hypercalcemia. Her hypercalcemia may also be contributing to patient's overall fatigue and dehydration. Patient currently has normal mental status. She does note diffuse pain and constipation was noted on abd XR, which could be related to elevated calcium. -NS @ KVO -- for possible fluid overload overnight -Calcitonin and Lasix given 12/2 - Corrected Ca = 11.6 -Consider starting bisphosphonate therapy for long term control -Continue to monitor for mental status changes -Monitor on telemetry   #New Onset A-Fib; ST changes on EKG - EKG; ST elevations seen in leads V2-4 - amlodipine and metoprolol being held - Diltiazem drip started by cardiology. - Echo pending - Troponins neg - will discuss with patient and family about the desire to pursue aggressive work up - TSH wnl  # Possible GI bleed. HgB on admission (11.0). No bright red blood to suggest acute GI bleed. -f/u gastric occult blood -Protonix 40mg  daily - Hgb 9.7 (12/5) - GI consulted -- appreciate the assistance.  # Esophageal Cancer. Previously followed at [facility] oncology. Recently moved to [facility] for second opinion. Has appointment on 12/7 with onc in [place]. Has diffuse body pains and weakness related to cancer diagnosis. 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  -Home tylenol and tramadol prn pain -All nutrition through PEG tube -Nutrition consulted -NPO at this time - monitor respiratory status given chronic lung issues  # SIRS. Leukocytosis (13.4), tachycardia, and tachypnea (22) on admission. Tachycardia chronic per patient and her family. No obvious source of infection. UA and CXR unremarkable. Lactic acid normal. Likely tachy and tachypneic relating to disease burden. - EKG shows sinus tachycardia - f/u urine culture - Initiate broad spectrum antibiotics and obtain blood cultures if patient deteriorates clinically.  - Continue to monitor  # Chronic Anemia. - Continue home iron supplementation  # HTN.  - continue home amlodipine and metoprolol  # Anxiety - continue home cymbalta  # Constipation: evident on imaging. Not a complaint of patient. - consider bowel regimen if no BM in am  # Right posterior thigh mass: concern would be for malignant process given history. Possibly a primary malignant process vs metastasis. - MRI femur to assess  # Hyponatremia: stable from previous value 3 weeks ago. Potentially related to poor intake, though less likely with feedings through PEG tube. - IVF per above - BMET in am.   FEN/GI: NPO, Nutrition consulted for tube feedings, NS @ KVO Prophylaxis: SCDs until GI bleed is ruled out.   Disposition: home when deemed medically stable. Possibility for hospice or SNF if patient willing.  Subjective:  Patient continues to have some slight abdominal/chest pain. No new issues overnight.   Objective: Temp:  [97.7 F (36.5 C)-99.4 F (37.4 C)] 99.4 F (37.4 C) (12/05 0700) Pulse Rate:  [90-188] 100 (12/05 0800) Resp:  [16-25] 21 (12/05 0800) BP: (88-154)/(53-94)  130/71 mmHg (12/05 0800) SpO2:  [95 %-100 %] 100 % (12/05 0800) Physical Exam: Gen: AAF lying in bed, appears older than stated age.  Cachectic, chronically ill appearing. HEENT: EOMI, PERRL. MMM Heart: irr irr. Slight blowing murmur noted at mitral.  Lungs: Right side with good movement. Left side with hyper-resonant breath sounds  Abd: PEG in place without surrounding erythema. Scaphoid abdomen. Ext: Roughly 10 cm in diameter firm mass noted posterior Right thigh. Mildly TTP.  Laboratory:  Recent Labs Lab 10/12/14 0616 10/13/14 0632 10/14/14 0327  WBC 13.1* 13.7* 14.6*  HGB 9.0* 9.7* 9.7*  HCT 27.8* 30.4* 27.6*  PLT 847* 877* 874*    Recent Labs Lab 10/22/2014 1015 10/12/14 0616 10/13/14 0632 10/14/14 0327  NA 132* 140 140 138  K 4.7 3.5* 3.6* 3.4*  CL 90* 100 101 98  CO2 28 25 24 26   BUN 21 16 12 13   CREATININE 0.48* 0.44* 0.39* 0.39*  CALCIUM 13.5* 10.2 10.5 10.8*  PROT 9.1* 8.0  --   --   BILITOT 0.3 0.2*  --   --   ALKPHOS 98 92  --   --   ALT 16 14  --   --   AST 47* 26  --   --   GLUCOSE 98 103* 81 97    Imaging/Diagnostic Tests: KUB 12/2 IMPRESSION: No acute finding in the abdomen with a large volume of stool in the rectosigmoid colon.  Near complete white of the left chest compatible with effusion and atelectasis. There appears to be a small loculated left basilar Pneumothorax.   Elberta Leatherwood, MD 10/14/2014, 10:57 AM PGY-1, [place] Intern pager: [phone], text pages welcome

## 2014-10-15 ENCOUNTER — Other Ambulatory Visit: Payer: Self-pay

## 2014-10-15 ENCOUNTER — Inpatient Hospital Stay (HOSPITAL_COMMUNITY): Payer: BC Managed Care – PPO

## 2014-10-15 DIAGNOSIS — R64 Cachexia: Secondary | ICD-10-CM

## 2014-10-15 DIAGNOSIS — Z515 Encounter for palliative care: Secondary | ICD-10-CM

## 2014-10-15 DIAGNOSIS — C159 Malignant neoplasm of esophagus, unspecified: Secondary | ICD-10-CM

## 2014-10-15 DIAGNOSIS — D649 Anemia, unspecified: Secondary | ICD-10-CM

## 2014-10-15 LAB — CBC
HCT: 27.5 % — ABNORMAL LOW (ref 36.0–46.0)
HEMOGLOBIN: 9.7 g/dL — AB (ref 12.0–15.0)
MCH: 30.9 pg (ref 26.0–34.0)
MCHC: 35.3 g/dL (ref 30.0–36.0)
MCV: 87.6 fL (ref 78.0–100.0)
PLATELETS: 902 10*3/uL — AB (ref 150–400)
RBC: 3.14 MIL/uL — AB (ref 3.87–5.11)
RDW: 16.8 % — ABNORMAL HIGH (ref 11.5–15.5)
WBC: 15.6 10*3/uL — ABNORMAL HIGH (ref 4.0–10.5)

## 2014-10-15 LAB — COMPREHENSIVE METABOLIC PANEL
ALK PHOS: 94 U/L (ref 39–117)
ALT: 16 U/L (ref 0–35)
ANION GAP: 14 (ref 5–15)
AST: 25 U/L (ref 0–37)
Albumin: 2.3 g/dL — ABNORMAL LOW (ref 3.5–5.2)
BUN: 13 mg/dL (ref 6–23)
CALCIUM: 11.3 mg/dL — AB (ref 8.4–10.5)
CO2: 25 mEq/L (ref 19–32)
Chloride: 100 mEq/L (ref 96–112)
Creatinine, Ser: 0.37 mg/dL — ABNORMAL LOW (ref 0.50–1.10)
GFR calc Af Amer: >90 mL/min (ref >90)
Glucose, Bld: 102 mg/dL — ABNORMAL HIGH (ref 70–99)
Potassium: 4 mEq/L (ref 3.7–5.3)
SODIUM: 139 meq/L (ref 137–147)
TOTAL PROTEIN: 8.1 g/dL (ref 6.0–8.3)
Total Bilirubin: 0.2 mg/dL — ABNORMAL LOW (ref 0.3–1.2)

## 2014-10-15 MED ORDER — LORAZEPAM 2 MG/ML IJ SOLN
2.0000 mg | Freq: Four times a day (QID) | INTRAMUSCULAR | Status: DC | PRN
  Administered 2014-10-15: 2 mg via INTRAVENOUS
  Filled 2014-10-15: qty 1

## 2014-10-15 MED ORDER — DIAZEPAM 5 MG/ML IJ SOLN
5.0000 mg | INTRAMUSCULAR | Status: DC | PRN
  Administered 2014-10-16 – 2014-10-17 (×3): 5 mg via INTRAVENOUS
  Filled 2014-10-15 (×3): qty 2

## 2014-10-15 MED ORDER — OSMOLITE 1.5 CAL PO LIQD
237.0000 mL | Freq: Three times a day (TID) | ORAL | Status: DC
Start: 2014-10-15 — End: 2014-10-17
  Administered 2014-10-15 – 2014-10-17 (×7): 237 mL
  Filled 2014-10-15 (×10): qty 237

## 2014-10-15 MED ORDER — DIAZEPAM 5 MG/ML IJ SOLN
5.0000 mg | Freq: Four times a day (QID) | INTRAMUSCULAR | Status: DC
  Administered 2014-10-15 – 2014-10-17 (×7): 5 mg via INTRAVENOUS
  Filled 2014-10-15 (×7): qty 2

## 2014-10-15 MED ORDER — HYDROMORPHONE HCL 1 MG/ML IJ SOLN
1.0000 mg | INTRAMUSCULAR | Status: DC | PRN
  Administered 2014-10-15 – 2014-10-17 (×12): 1 mg via INTRAVENOUS
  Filled 2014-10-15 (×12): qty 1

## 2014-10-15 MED ORDER — KCL IN DEXTROSE-NACL 20-5-0.45 MEQ/L-%-% IV SOLN
INTRAVENOUS | Status: DC
  Filled 2014-10-15: qty 1000

## 2014-10-15 MED ORDER — METOPROLOL TARTRATE 1 MG/ML IV SOLN
5.0000 mg | Freq: Once | INTRAVENOUS | Status: AC
  Administered 2014-10-15: 5 mg via INTRAVENOUS

## 2014-10-15 MED ORDER — MORPHINE SULFATE 2 MG/ML IJ SOLN
1.0000 mg | INTRAMUSCULAR | Status: DC | PRN
  Administered 2014-10-15: 1 mg via INTRAVENOUS
  Filled 2014-10-15: qty 1

## 2014-10-15 MED ORDER — SCOPOLAMINE 1 MG/3DAYS TD PT72
1.0000 | MEDICATED_PATCH | TRANSDERMAL | Status: DC
  Administered 2014-10-15: 1.5 mg via TRANSDERMAL
  Filled 2014-10-15: qty 1

## 2014-10-15 MED ORDER — IPRATROPIUM BROMIDE 0.02 % IN SOLN
0.5000 mg | RESPIRATORY_TRACT | Status: DC
  Administered 2014-10-15 – 2014-10-17 (×10): 0.5 mg via RESPIRATORY_TRACT
  Filled 2014-10-15 (×11): qty 2.5

## 2014-10-15 MED ORDER — SENNOSIDES-DOCUSATE SODIUM 8.6-50 MG PO TABS
1.0000 | ORAL_TABLET | Freq: Every day | ORAL | Status: DC
  Administered 2014-10-15 – 2014-10-16 (×2): 1
  Filled 2014-10-15 (×2): qty 1

## 2014-10-15 MED ORDER — DEXAMETHASONE SODIUM PHOSPHATE 4 MG/ML IJ SOLN
2.0000 mg | INTRAMUSCULAR | Status: DC
  Administered 2014-10-15 – 2014-10-16 (×2): 2 mg via INTRAVENOUS
  Filled 2014-10-15 (×3): qty 0.5

## 2014-10-15 MED ORDER — METOPROLOL TARTRATE 1 MG/ML IV SOLN
INTRAVENOUS | Status: AC
  Filled 2014-10-15: qty 5

## 2014-10-15 NOTE — Progress Notes (Signed)
Critical lab called of platelets 902.  Md already aware of high platelet count of 874 yesterday.  Will continue to monitor. Saunders Revel T

## 2014-10-15 NOTE — Progress Notes (Addendum)
Subjective:  Shortness of breath. AFIB with RVR. Challenging to control.    Objective:  Vital Signs in the last 24 hours: Temp:  [98 F (36.7 C)-99.1 F (37.3 C)] 99.1 F (37.3 C) (12/06 0716) Pulse Rate:  [90-190] 117 (12/06 0723) Resp:  [22-35] 31 (12/06 0723) BP: (116-165)/(62-89) 128/86 mmHg (12/06 0717) SpO2:  [98 %-100 %] 100 % (12/06 0723)  Intake/Output from previous day: 12/05 0701 - 12/06 0700 In: 1765.1 [I.V.:1048.1; NG/GT:480] Out: 2451 [Urine:2450; Stool:1]   Physical Exam: General: Thin, ill appearing, in respiratory distress. Head:  Normocephalic and atraumatic. Lungs: Decreased BS entire Left lung Heart: Tachy RR.  2/6 S murmur, no rubs or gallops.  Abdomen: PEG soft, non-tender, thin,  positive bowel sounds. Extremities: No clubbing or cyanosis. No edema. Neurologic: Alert and oriented x 3.    Lab Results:  Recent Labs  10/14/14 0327 10/15/14 0337  WBC 14.6* 15.6*  HGB 9.7* 9.7*  PLT 874* 902*    Recent Labs  10/14/14 0327 10/15/14 0337  NA 138 139  K 3.4* 4.0  CL 98 100  CO2 26 25  GLUCOSE 97 102*  BUN 13 13  CREATININE 0.39* 0.37*    Recent Labs  10/13/14 1235  TROPONINI <0.30   Hepatic Function Panel  Recent Labs  10/15/14 0337  PROT 8.1  ALBUMIN 2.3*  AST 25  ALT 16  ALKPHOS 94  BILITOT 0.2*      Imaging: Mr Femur Right W Wo Contrast  10/14/2014   CLINICAL DATA:  Soft tissue mass in the right posterior thighs  EXAM: MRI OF THE RIGHT FEMUR WITHOUT AND WITH CONTRAST  TECHNIQUE: Multiplanar, multisequence MR imaging of the lower right extremity was performed both before and after administration of intravenous contrast.  CONTRAST:  43m MULTIHANCE GADOBENATE DIMEGLUMINE 529 MG/ML IV SOLN  COMPARISON:  None.  FINDINGS: There are 2 rounded T1 hypointense lesions in the proximal right femoral diaphysis. There is a 13 mm osseous lesion in the right posterior acetabulum. There is a possible 4 mm lesion in the proximal left  femoral diaphysis. There is no fracture or dislocation.  There is a 4.3 x 4.9 x 7.8 cm avidly enhancing soft tissue mass in the long head of the right biceps femoris muscle with nonenhancing areas within likely reflecting necrosis. The mass is localized to the muscle. There is no extra muscular extension. The remainder of the flexor and extensor muscles are normal in signal.  There is no other soft tissue mass. There is no hematoma or fluid collection.  IMPRESSION: 1. There is a 4.3 x 4.9 x 7.8 cm avidly enhancing soft tissue mass in the long head of the right biceps femoris muscle with nonenhancing areas within likely reflecting necrosis. Differential diagnosis includes primary malignancy such as pleomorphic undifferentiated sarcoma versus metastatic disease versus plasmacytoma given the hypointense lesions in the right proximal femur and right posterior acetabulum. 2. There are 2 bone lesions in the proximal right femur and a single bone lesion in the right posterior acetabulum concerning for metastatic disease versus multiple myeloma.   Electronically Signed   By: HKathreen Devoid  On: 10/14/2014 13:40   Dg Chest Port 1 View  10/14/2014   CLINICAL DATA:  Acute onset of wheezing and shortness of breath today. Initial encounter.  EXAM: PORTABLE CHEST - 1 VIEW  COMPARISON:  Chest radiograph performed 10/23/2014  FINDINGS: There is persistent complete opacification of the left hemithorax, reflecting a combination of pleural effusion and  atelectasis. Leftward mediastinal shift is again seen. Mild vascular congestion is noted at the right lung, with mild right basilar atelectasis. No pneumothorax is seen.  An esophageal stent is noted. The cardiomediastinal silhouette is not well assessed due to opacification of the left hemithorax. No acute osseous abnormalities are identified. There is elevation of the left hemidiaphragm, with underlying air in the stomach and splenic flexure of the colon.  IMPRESSION: 1. Stable  complete opacification of the left hemithorax, reflecting a combination of pleural effusion and atelectasis. 2. Mild vascular congestion at the right lung, with mild right basilar atelectasis.   Electronically Signed   By: Garald Balding M.D.   On: 10/14/2014 04:34   Dg Chest Port 1v Same Day  10/15/2014   CLINICAL DATA:  Wheezing.  Short of breath.  EXAM: PORTABLE CHEST - 1 VIEW SAME DAY  COMPARISON:  None.  FINDINGS: Stable esophageal stent. Complete opacification of the left hemi thorax with shift of the mediastinum to the left is stable. Right peritracheal soft tissue mass stable. Reticulonodular opacities throughout the right lung are stable. This is worrisome for edema.  IMPRESSION: Stable opacification of the left lung with mediastinal shift likely lung collapse. Pleural effusion is not excluded.  Edema throughout the right lung is suspected   Electronically Signed   By: Maryclare Bean M.D.   On: 10/15/2014 09:10   Telemetry: Sinus tach 100 Personally viewed. Episode of AFIB 180's noted  Assessment/Plan:   58 year old with esophageal cancer, XRT at [facility] for palliation, with electrolyte abnormalities (hypercalcemic) with recent PAF with RVR (up to 194)  AFIB with RVR  - improved on IV dilt 20  - IV metoprolol 39m IV seemed to help this AM.   - IV amio could be next option for palliation inspite of likely liver malfunction.    - not anticoagulation candidate  - on metoprolol 50 bid as well  - exacerbated by underlying condition (now with complete opacification of left lung due to underlying CA).   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 Unfortunate situation. Palliation. Chance of survival quite low. Would try to avoid intubation given poor long term prognosis. Discussed with Dr. BLindell Noe Palliative care team.    SCandee Furbish12/04/2014, 9:29 AM

## 2014-10-15 NOTE — Progress Notes (Signed)
Chaplain observed large family gathered in waiting area and heard deep cries of grief by some of the members. Doctor was just leaving and returning to [facility].  Chaplain was entering the same area and approached doctor to offer services.  MD responded that spiritual support would be needed as family had just received bad news about patient.  Patient's two daughters, two sisters and a variety of family members were present.  Family friend requested prayer which chaplain offered both in the waiting area and bedside with family and patient. MD found a large conference room on 1st floor and chaplain directed a nephew to the area in case family members want to gather there.  Family preferred to remain on the same floor in the main area at this time. Chaplain checked in later in the afternoon noting that more family members had arrived. Escorted another family member to conference room on 1st floor and spoke to various family members in waiting room and at bedside. Patient was sleeping.  Recommend FU by floor chaplain.   Luana Shu [phone]   10/15/14 1225  Clinical Encounter Type  Visited With Patient;Patient and family together  Visit Type Spiritual support;Psychological support  Spiritual Encounters  Spiritual Needs Prayer;Grief support  Stress Factors  Patient Stress Factors Health changes  Family Stress Factors Loss;Loss of control

## 2014-10-15 NOTE — Progress Notes (Signed)
Family service Dr informed of p CXR results . Pt's hr currently sustained in 90's ( A-Fib ) after metoprolol 5 mg IV ( 10am scheduled metoprolol 50 mg given via peg tube @ 0800am) Also, cardizem gtt infusing at 20 mg/hr since 0745 am .

## 2014-10-15 NOTE — Progress Notes (Signed)
Family Medicine Teaching Service Daily Progress Note Intern Pager: [phone]  Patient name: Shelby Armstrong Medical record number: [id number] Date of birth: [birth year] Age: 58 y.o. Gender: female  Primary Care Provider: Lupita Dawn, MD Consultants: GI Code Status: Full  Pt Overview and Major Events to Date:  12/2: admitted for blood in PEG tube 12/6: palliative care consulted  Assessment and Plan: # Hypercalcemia. Patient's calcium on admission corrects to 14.7. Patient has prior history per chart review (Ca ~11). Likely related to patient's malignancy. Patient also appears clinically dehydrated likely secondary to hypercalcemia induced diuresis, or potentially dehydration may be contributing to her hypercalcemia. Her hypercalcemia may also be contributing to patient's overall fatigue and dehydration. Patient currently has normal mental status. She does note diffuse pain and constipation was noted on abd XR, which could be related to elevated calcium. - IVF as below - Calcitonin  - Corrected Ca = 12.6 - Consider starting bisphosphonate therapy for long term control - Continue to monitor for mental status changes - Monitor on telemetry   #New Onset A-Fib; ST changes on EKG - EKG; ST elevations seen in leads V2-4 - amlodipine and metoprolol being held - Diltiazem drip by cardiology. - Echo pending - Troponins neg - will discuss with patient and family about the desire to pursue aggressive work up; palliative care c/s - TSH wnl  # Possible GI bleed. HgB on admission (11.0). No bright red blood to suggest acute GI bleed. -f/u gastric occult blood -Protonix 40mg  daily - Hgb 9.7 (12/5) - GI consulted -- appreciate the assistance.  # Esophageal Cancer. Previously followed at [facility] oncology. Recently moved to [facility] for second opinion. Has appointment on 12/7 with onc in [place]. Has diffuse body pains and weakness related to cancer diagnosis. Also with chronic complete white  out of left lung secondary to post obstructive atelectasis that is stable here on admission.  -Home tylenol and tramadol prn pain - Morphine 1mg  q4hrs prn -All nutrition through PEG tube -Nutrition consulted -NPO at this time - monitor respiratory status given chronic lung issues  # SIRS. Leukocytosis (13.4), tachycardia, and tachypnea (22) on admission. Tachycardia chronic per patient and her family. No obvious source of infection. UA and CXR unremarkable. Lactic acid normal. Likely tachy and tachypneic relating to disease burden. - EKG shows sinus tachycardia - f/u urine culture - Initiate broad spectrum antibiotics and obtain blood cultures if patient deteriorates clinically.  - Continue to monitor  # Chronic Anemia. - Continue home iron supplementation  # HTN.  - continue home amlodipine and metoprolol  # Anxiety - continue home cymbalta  # Constipation: evident on imaging. Not a complaint of patient. - consider bowel regimen if no BM in am  # Right posterior thigh mass: concern would be for malignant process given history. Possibly a primary malignant process vs metastasis. - MRI femur to assess  # Hyponatremia: stable from previous value 3 weeks ago. Potentially related to poor intake, though less likely with feedings through PEG tube. - IVF per above - BMET in am.   FEN/GI: NPO, Nutrition consulted for tube feedings, NS @ KVO Prophylaxis: SCDs until GI bleed is ruled out.   Disposition: home when deemed medically stable. Possibility for hospice or SNF if patient willing.  Subjective:  Patient complains of back pain and trouble breathing that is unchanged since admit. Continue to be Full code but would like to talk to palliative care.   Objective: Temp:  [98 F (36.7 C)-99.1 F (37.3 C)]  99.1 F (37.3 C) (12/06 0716) Pulse Rate:  [90-190] 97 (12/06 0935) Resp:  [20-35] 20 (12/06 0935) BP: (116-165)/(62-92) 148/92 mmHg (12/06 0935) SpO2:  [98 %-100 %] 98 %  (12/06 0935) Physical Exam: Gen: AAF lying in bed, appears older than stated age. Cachectic, chronically ill appearing. HEENT: EOMI, PERRL. MMM Heart: irr irr. Slight blowing murmur noted at mitral.  Lungs: Right side with good movement. Left side with hyper-resonant breath sounds  Abd: PEG in place without surrounding erythema. Scaphoid abdomen. Ext: Roughly 10 cm in diameter firm mass noted posterior Right thigh. Mildly TTP.  Laboratory:  Recent Labs Lab 10/13/14 0981 10/14/14 0327 10/15/14 0337  WBC 13.7* 14.6* 15.6*  HGB 9.7* 9.7* 9.7*  HCT 30.4* 27.6* 27.5*  PLT 877* 874* 902*    Recent Labs Lab 11/04/2014 1015 10/12/14 0616 10/13/14 0632 10/14/14 0327 10/15/14 0337  NA 132* 140 140 138 139  K 4.7 3.5* 3.6* 3.4* 4.0  CL 90* 100 101 98 100  CO2 28 25 24 26 25   BUN 21 16 12 13 13   CREATININE 0.48* 0.44* 0.39* 0.39* 0.37*  CALCIUM 13.5* 10.2 10.5 10.8* 11.3*  PROT 9.1* 8.0  --   --  8.1  BILITOT 0.3 0.2*  --   --  0.2*  ALKPHOS 98 92  --   --  94  ALT 16 14  --   --  16  AST 47* 26  --   --  25  GLUCOSE 98 103* 81 97 102*    Imaging/Diagnostic Tests: KUB 12/2 IMPRESSION: No acute finding in the abdomen with a large volume of stool in the rectosigmoid colon.  Near complete white of the left chest compatible with effusion and atelectasis. There appears to be a small loculated left basilar Pneumothorax.   Olam Idler, MD 10/15/2014, 11:26 AM PGY-2, [place] Intern pager: [phone], text pages welcome

## 2014-10-15 NOTE — Consult Note (Signed)
Palliative Medicine Team at [facility]  Date: 10/15/2014   Patient Name: Shelby Armstrong  DOB: Mar 12, 1956  MRN: [id number]  Age / Sex: 58 y.o., female   PCP: Lupita Dawn, MD Referring Physician: Willeen Niece, MD  Active Problems: Principal Problem:   GI bleed Active Problems:   SCC (squamous cell carcinoma of esophagus)   Essential hypertension, benign   Cachexia   Tachycardia   Normocytic anemia   Squamous cell carcinoma of esophagus   Dehydration   Esophageal cancer   Hypercalcemia   Soft tissue mass   Paroxysmal atrial fibrillation   HPI/Reason for Consultation: Shelby Armstrong is a 58 y.o. female with SCC of the esophagus and to the femur with metastatic disease, complete white out of the left lung. She has had a complex disease treatment course with XRT with trach, PEG and prior prolonged admission at [facility]. Family brough patient here about a month ago by car for a second opinion and refused hospice services. She is now admitted with worsening confusion, pain, and SOB and found to have complete left lung opacification and worsening metastatic disease.  Participants in Discussion: 2 daughters, 2 sisters and one brother, multiple nieces and nephews   Advance Directive: no   Code Status Orders        Start     Ordered   10/15/14 1225  Do not attempt resuscitation (DNR)   Continuous    Question Answer Comment  In the event of cardiac or respiratory ARREST Do not call a "code blue"   In the event of cardiac or respiratory ARREST Do not perform Intubation, CPR, defibrillation or ACLS   In the event of cardiac or respiratory ARREST Use medication by any route, position, wound care, and other measures to relive pain and suffering. May use oxygen, suction and manual treatment of airway obstruction as needed for comfort.      10/15/14 1234      I have reviewed the medical record, interviewed the patient and family, and examined the patient. The following aspects are  pertinent.  Past Medical History  Diagnosis Date  . Tachycardia   . Tracheoesophageal fistula   . Hypertension   . Aspiration pneumonia ~ 07/2014  . Squamous cell carcinoma of esophagus dx'd ~ 07/2014   History   Social History  . Marital Status: Single    Spouse Name: N/A    Number of Children: N/A  . Years of Education: N/A   Social History Main Topics  . Smoking status: Former Smoker -- 1.50 packs/day for 38 years    Types: Cigarettes    Start date: 11/10/1976    Quit date: 05/10/2014  . Smokeless tobacco: Never Used  . Alcohol Use: 0.0 oz/week    0 Not specified per week     Comment: History of alchohol abuse  . Drug Use: No  . Sexual Activity: None   Other Topics Concern  . None   Social History Narrative   Family History  Problem Relation Age of Onset  . Hypertension Mother   . Alcoholism Father   . Asthma Sister   . Breast cancer Sister   . Hypertension Sister   . Lung cancer Brother    Scheduled Meds: . antiseptic oral rinse  7 mL Mouth Rinse BID  . calcitonin  4 Units/kg Subcutaneous Q12H  . chlorhexidine  15 mL Mouth Rinse BID  . dexamethasone  2 mg Intravenous Q24H  . diazepam  5 mg Intravenous 4 times per day  .  feeding supplement (OSMOLITE 1.5 CAL)  237 mL Per Tube TID  . ipratropium  0.5 mg Nebulization Q4H  . metoprolol  50 mg Per Tube BID  . pantoprazole sodium  40 mg Per Tube Daily  . potassium chloride  20 mEq Oral Daily  . scopolamine  1 patch Transdermal Q72H  . senna-docusate  1 tablet Per Tube QHS  . sodium chloride  3 mL Intravenous Q12H   Continuous Infusions: . sodium chloride 75 mL (10/14/14 2141)  . diltiazem (CARDIZEM) infusion 10 mg/hr (10/15/14 1026)   PRN Meds:.acetaminophen, diazepam, HYDROmorphone (DILAUDID) injection No Known Allergies CBC:    Component Value Date/Time   WBC 15.6* 10/15/2014 0337   WBC 10.1 09/18/2014   HGB 9.7* 10/15/2014 0337   HCT 27.5* 10/15/2014 0337   PLT 902* 10/15/2014 0337   MCV 87.6  10/15/2014 0337   NEUTROABS 11.3* 10/29/2014 1015   LYMPHSABS 1.0 10/18/2014 1015   MONOABS 1.2* 10/15/2014 1015   EOSABS 0.1 10/22/2014 1015   BASOSABS 0.0 10/12/2014 1015   Comprehensive Metabolic Panel:    Component Value Date/Time   NA 139 10/15/2014 0337   NA 132* 09/20/2014   K 4.0 10/15/2014 0337   CL 100 10/15/2014 0337   CO2 25 10/15/2014 0337   BUN 13 10/15/2014 0337   BUN 9 09/20/2014   CREATININE 0.37* 10/15/2014 0337   CREATININE 0.6 09/20/2014   GLUCOSE 102* 10/15/2014 0337   CALCIUM 11.3* 10/15/2014 0337   AST 25 10/15/2014 0337   ALT 16 10/15/2014 0337   ALKPHOS 94 10/15/2014 0337   BILITOT 0.2* 10/15/2014 0337   PROT 8.1 10/15/2014 0337   ALBUMIN 2.3* 10/15/2014 0337    Vital Signs: BP 148/92 mmHg  Pulse 97  Temp(Src) 99.1 F (37.3 C) (Oral)  Resp 20  Ht 5\' 1"  (1.549 m)  Wt 37.558 kg (82 lb 12.8 oz)  BMI 15.65 kg/m2  SpO2 98% Filed Weights   11/08/2014 1454  Weight: 37.558 kg (82 lb 12.8 oz)   12/05 0701 - 12/06 0700 In: 1765.1 [I.V.:1048.1; NG/GT:480] Out: 2451 [Urine:2450; Stool:1]  Physical Exam:  Very frail, lying on her side very dyspneic, very congested and rhonchorous, grimacing and yelling out for help. Confused and agitated. +fasicluations   Summary of Established Goals of Care and Medical Treatment Preferences  Primary Diagnoses  1. Esophageal cancer, prior trach, PEG and XRT, seen at [facility], not a chemotherapy candidate. 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 2. Hypercalcemia of Malignancy 3. Malnutrition 4. Complete Opacification of left lung  Active Symptoms: 1. Dyspnea 2. Pain 3. Dysphagia   Psycho-social/Spiritual: Family demonstrated very active grief - I am not certain they had every really processed or truly heard that Shelby Armstrong was dying. The are actively grieving but able to process the severity of her condition and that she will likely not survive this hospitalization.   Prognosis: <2 weeks (possible hours-days)   Palliative  Performance Scale: 20%  Recommendations:  1. DNR -Code status discussed- would be completely medically unreasonable to inflict pain and suffering through CPR and intubation on this lady who is approaching end of life and has irreversible terminal illness. I have consented family for this order with a very strong recommendation for DNR. I also provided reassurance that this did not mean do not treat and that we would do everything possible that would benefit their mother.  2. Pain/Dyspnea/Distress: Primary Goal is Comfort  Dilaudid IV q2 PRN for pain and dyspnea  Ipratroprium Nebs q4 for secretion  and airway dilation-avoid B-agonists due to her HR  Scheduled diazepam for air hunger and agitation  Scop patch  I reduced tube feeding amount so that her stomach is not getting distended making it harder for her to breathe.  "Good Lung Down" positioning on her right side is key for comfort.  3. Additional scope of treatment- would for now continue IV diltiazem and treatment of hypercalcemia -transition to per tube as soon as possible. This will need to be a step wise approach - family just now hearing how serious this is today and that she may not survive hospitalization. Avoid any painful or invasive procedures or sticks.   Will follow.   Time: 11:30-1PM Time Total: 90 minutes Greater than 50%  of this time was spent counseling and coordinating care related to the above assessment and plan.  Signed by: Roma Schanz, DO  10/15/2014, 12:37 PM  Please contact Palliative Medicine Team phone at [phone] for questions and concerns.

## 2014-10-15 NOTE — Progress Notes (Signed)
Dr at bedside . Dr aware Pt converted to A-fib w/RVR HR sustained 120's to 150's .Cardizem gtt increased to 20mg /hr ( V.O. order for titration of cardizem gtt received) .

## 2014-10-15 NOTE — Progress Notes (Signed)
FMTS Attending Note Patient seen and examined by me, patient's HR jumped to 160-190s on diltiazem drip. Diffuse wheezing on exam.  Patient is alert, somewhat agitated. Denies pain or dyspnea. She is now on 2L/min oxygen nasal canula.   Discussed with patient the findings of her MRI R leg yesterday, and that it likely represents metastatic disease, less likely a new cancer, either way a poor prognosis. Revisited topic of code status, patient states she wishes for all resuscitative measures. There is no family at bedside this morning at the time of my visit. Plans to titrate up diltiazem gtt. Written order for lorazepam prn agitation.  Plan for Palliative Care consult for Jet discussion. Dalbert Mayotte, MD

## 2014-10-16 ENCOUNTER — Ambulatory Visit: Payer: BC Managed Care – PPO

## 2014-10-16 ENCOUNTER — Ambulatory Visit: Payer: BC Managed Care – PPO | Admitting: Hematology

## 2014-10-16 DIAGNOSIS — C7951 Secondary malignant neoplasm of bone: Secondary | ICD-10-CM | POA: Diagnosis present

## 2014-10-16 DIAGNOSIS — I10 Essential (primary) hypertension: Secondary | ICD-10-CM

## 2014-10-16 MED ORDER — DIGOXIN 0.25 MG/ML IJ SOLN
0.0625 mg | Freq: Every day | INTRAMUSCULAR | Status: DC
  Filled 2014-10-16: qty 0.5

## 2014-10-16 MED ORDER — LORATADINE 5 MG/5ML PO SYRP
10.0000 mg | ORAL_SOLUTION | Freq: Once | ORAL | Status: AC
  Administered 2014-10-16: 10 mg
  Filled 2014-10-16: qty 10

## 2014-10-16 MED ORDER — DIGOXIN 0.25 MG/ML IJ SOLN
0.2500 mg | Freq: Two times a day (BID) | INTRAMUSCULAR | Status: AC
  Administered 2014-10-16 – 2014-10-17 (×2): 0.25 mg via INTRAVENOUS
  Filled 2014-10-16 (×3): qty 1

## 2014-10-16 NOTE — Progress Notes (Signed)
Nutrition Brief Note  Chart reviewed. Pt now transitioning to comfort care.  No further nutrition interventions warranted at this time.  Please re-consult as needed.   Jacia Sickman A. Kort Stettler, RD, LDN Pager: [phone] After hours Pager: [phone]  

## 2014-10-16 NOTE — Plan of Care (Signed)
Problem: Phase III Progression Outcomes Goal: Foley discontinued Outcome: Not Applicable Date Met:  66/29/47

## 2014-10-16 NOTE — Clinical Documentation Improvement (Signed)
  Please clarify nutritional status. Thank you.  . Severity: --Mild (first degree) --Moderate (second degree) --Severe (third degree) . Avoid documenting a range of severity, such as "moderate to severe" . Form: --Kwashiorkor (rarely seen in the [place] --Marasmus --Marasmic kwashiorkor --Other . Document any associated diagnoses/conditions  Supporting Information:  PER RD ASSESSMENT: Pt meets criteria for severe MALNUTRITION in the context of chronic illness as evidenced by severe fat and muscle wasting. NUTRITION DIAGNOSIS: Inadequate oral intake related to inability to eat as evidenced by NPO.  Nutrition Focused Physical Exam:  Subcutaneous Fat:  Orbital Region: severe depletion Upper Arm Region: severe depletion Thoracic and Lumbar Region: severe depletion  Muscle:  Temple Region: severe depletion Clavicle Bone Region: severe depletion Clavicle and Acromion Bone Region: severe depletion Scapular Bone Region: severe depletion Dorsal Hand: severe depletion Patellar Region: severe depletion Anterior Thigh Region: severe depletion Posterior Calf Region: severe depletion  Height 5'1"  Weight 82 lbs  BMI  15.65  Patient has PEG & is NPO currently  Thank You, Monette Omara T. Pricilla Handler, MSN, MBA/MHA Clinical Documentation Specialist [email or web address] Office # [phone]

## 2014-10-16 NOTE — Plan of Care (Signed)
Problem: Phase II Progression Outcomes Goal: Vital signs remain stable Outcome: Completed/Met Date Met:  10/16/14 Goal: Obtain order to discontinue catheter if appropriate Outcome: Not Applicable Date Met:  67/12/45

## 2014-10-16 NOTE — Progress Notes (Signed)
Large family and wide array of emotions and grief- family processing. I will meet with them in the morning at 9AM to discuss next steps in her care. Shelby Armstrong is currently comfortable-maintain current level of care.  Lane Hacker, DO Palliative Medicine [phone]

## 2014-10-16 NOTE — Progress Notes (Signed)
Dr. Caryl Bis notified of pt's C/O itching of skin. No noticeable redness, hives or edema.

## 2014-10-16 NOTE — Progress Notes (Signed)
Family Medicine Teaching Service Daily Progress Note Intern Pager: [phone]  Patient name: Shelby Armstrong Medical record number: [id number] Date of birth: 11/16/1955 Age: 58 y.o. Gender: female  Primary Care Provider: Lupita Dawn, MD Consultants: GI Code Status: DNR  << according to Palliative note.  Pt Overview and Major Events to Date:  12/2: admitted for blood in PEG tube 12/6: palliative care consulted; changed to DNR status  Assessment and Plan: # Hypercalcemia. Patient's calcium on admission corrects to 14.7. Patient has prior history per chart review (Ca ~11). Likely related to patient's malignancy. Patient also appears clinically dehydrated likely secondary to hypercalcemia induced diuresis, or potentially dehydration may be contributing to her hypercalcemia. Her hypercalcemia may also be contributing to patient's overall fatigue and dehydration. Patient currently has normal mental status. She does note diffuse pain and constipation was noted on abd XR, which could be related to elevated calcium. - IVF as below - Calcitonin  - Corrected Ca = 12.6 - Consider starting bisphosphonate therapy for long term control - will assess after discussion w/ pt/family about goals of care.  - Pamidronate 53m over 2 hrs every 3-4wks if decision to start is made. - Continue to monitor for mental status changes - Monitor on telemetry  - No New Labs Today -- reducing sticks per palliative  #New Onset A-Fib; ST changes on EKG - EKG; ST elevations seen in leads V2-4 - amlodipine and metoprolol being held - Diltiazem drip by cardiology. - Troponins neg - TSH wnl  # Possible GI bleed. HgB on admission (11.0). No bright red blood to suggest acute GI bleed. -f/u gastric occult blood -Protonix 4232mdaily - Hgb 9.7 (12/6) - GI consulted -- appreciate the assistance.  # [facility] [place] related to cancer diagnosis. 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  -Home tylenol and tramadol prn pain - Morphine 32m76mhanged to Dilaudid 32mg36mH PRN - All nutrition through PEG tube - Nutrition consulted - NPO at this time - monitor respiratory status given chronic lung issues - Greatly appreciate the recs from Palliative care  # SIRS. Leukocytosis (13.4), tachycardia, and tachypnea (22) on admission. Tachycardia chronic per patient and her family. No obvious source of infection. UA and CXR unremarkable. Lactic acid normal. Likely tachy and tachypneic relating to disease burden. - EKG shows sinus tachycardia - f/u urine culture - Initiate broad spectrum antibiotics and obtain blood cultures if patient deteriorates clinically.  - Continue to monitor  # Chronic Anemia. - Continue home iron supplementation  # HTN.  - continue home amlodipine and metoprolol  # Anxiety - continue home cymbalta  # Constipation: evident on imaging. Not a complaint of patient. - consider bowel regimen if no BM in am  # Right posterior thigh mass: concern would be for malignant process given history. Possibly a primary malignant process vs metastasis. - MRI femur to assess - mass w/ evidence of necrosis, likely a met - 2 additional (suspected) mets noted within femur and one in acetabulum   # Hyponatremia: stable from previous value 3 weeks ago. Potentially related to poor intake, though less likely with feedings through PEG tube. - IVF per above - BMET in am.   FEN/GI: NPO, Nutrition consulted for tube feedings, NS @ KVO Prophylaxis: SCDs until  GI bleed is ruled out.   Disposition: home when deemed medically stable. Possibility for hospice or SNF if patient willing.  Subjective:  Joined by family. Patient continues to report some discomfort but  states that it is very well controlled. No new issues overnight. Grateful for care.   Objective: Temp:  [98.3 F (36.8 C)-98.7 F (37.1 C)] 98.6 F (37 C) (12/07 1141) Pulse Rate:  [73-97] 85 (12/07 1141) Resp:  [14-26] 20 (12/07 1141) BP: (120-179)/(67-86) 170/67 mmHg (12/07 1141) SpO2:  [93 %-100 %] 96 % (12/07 1141) FiO2 (%):  [28 %] 28 % (12/06 1624) Physical Exam: Gen: AAF lying in bed, appears older than stated age. Cachectic, chronically ill appearing. HEENT: EOMI, PERRL. MMM Heart: irr irr. Slight blowing murmur noted at mitral.  Lungs: Right side with good movement. Left side with hyper-resonant breath sounds  Abd: PEG in place without surrounding erythema. Scaphoid abdomen. Ext: Roughly 10 cm in diameter firm mass noted posterior Right thigh. Mildly TTP.  Laboratory:  Recent Labs Lab 10/13/14 6834 10/14/14 0327 10/15/14 0337  WBC 13.7* 14.6* 15.6*  HGB 9.7* 9.7* 9.7*  HCT 30.4* 27.6* 27.5*  PLT 877* 874* 902*    Recent Labs Lab 10/12/2014 1015 10/12/14 0616 10/13/14 0632 10/14/14 0327 10/15/14 0337  NA 132* 140 140 138 139  K 4.7 3.5* 3.6* 3.4* 4.0  CL 90* 100 101 98 100  CO2 28 25 24 26 25   BUN 21 16 12 13 13   CREATININE 0.48* 0.44* 0.39* 0.39* 0.37*  CALCIUM 13.5* 10.2 10.5 10.8* 11.3*  PROT 9.1* 8.0  --   --  8.1  BILITOT 0.3 0.2*  --   --  0.2*  ALKPHOS 98 92  --   --  94  ALT 16 14  --   --  16  AST 47* 26  --   --  25  GLUCOSE 98 103* 81 97 102*    Imaging/Diagnostic Tests: KUB 12/2 IMPRESSION: No acute finding in the abdomen with a large volume of stool in the rectosigmoid colon.  Near complete white of the left chest compatible with effusion and atelectasis. There appears to be a small loculated left basilar Pneumothorax.  MRI Rt Femur 12/5 IMPRESSION: 1. There is a 4.3 x 4.9 x 7.8 cm avidly enhancing soft tissue mass in the long head of the right biceps femoris muscle with nonenhancing areas within likely reflecting  necrosis. Differential diagnosis includes primary malignancy such as pleomorphic undifferentiated sarcoma versus metastatic disease versus plasmacytoma given the hypointense lesions in the right proximal femur and right posterior acetabulum. 2. There are 2 bone lesions in the proximal right femur and a single bone lesion in the right posterior acetabulum concerning for metastatic disease versus multiple myeloma.   Elberta Leatherwood, MD 10/16/2014, 12:28 PM PGY-1, [place] Intern pager: [phone], text pages welcome

## 2014-10-16 NOTE — Progress Notes (Signed)
       Patient Name: Shelby Armstrong Date of Encounter: 10/16/2014    SUBJECTIVE: No specific cardiac complaints  TELEMETRY:  A. fib with intermittent RVR Filed Vitals:   10/16/14 0300 10/16/14 0400 10/16/14 0725 10/16/14 1141  BP: 140/68 179/77 172/86 170/67  Pulse: 75 83 97 85  Temp: 98.4 F (36.9 C)  98.4 F (36.9 C) 98.6 F (37 C)  TempSrc: Oral  Oral Oral  Resp: 14 19 24 20   Height:      Weight:      SpO2: 98% 96% 98% 96%    Intake/Output Summary (Last 24 hours) at 10/16/14 1324 Last data filed at 10/16/14 1200  Gross per 24 hour  Intake   1377 ml  Output    800 ml  Net    577 ml   LABS: Basic Metabolic Panel:  Recent Labs  10/14/14 0327 10/15/14 0337  NA 138 139  K 3.4* 4.0  CL 98 100  CO2 26 25  GLUCOSE 97 102*  BUN 13 13  CREATININE 0.39* 0.37*  CALCIUM 10.8* 11.3*   CBC:  Recent Labs  10/14/14 0327 10/15/14 0337  WBC 14.6* 15.6*  HGB 9.7* 9.7*  HCT 27.6* 27.5*  MCV 87.6 87.6  PLT 874* 902*     Radiology/Studies:  Large effusion on the right by chest x-ray  Physical Exam: Blood pressure 170/67, pulse 85, temperature 98.6 F (37 C), temperature source Oral, resp. rate 20, height 5\' 1"  (1.549 m), weight 82 lb 12.8 oz (37.558 kg), SpO2 96 %. Weight change:   Wt Readings from Last 3 Encounters:  10/22/2014 82 lb 12.8 oz (37.558 kg)  09/27/14 82 lb 12.8 oz (37.558 kg)    Diminished breath sounds on right Rapid irregular rate   ASSESSMENT:  1. Atrial fibrillation with rapid ventricular response 2. Etiology of atrial fibrillations uncertain but could be related to pericardial metastases  Plan:  Rate control with metoprolol. Add digoxin.  Demetrios Isaacs 10/16/2014, 1:24 PM

## 2014-10-16 NOTE — Progress Notes (Signed)
   10/16/14 1100  Clinical Encounter Type  Visited With Family  Visit Type Spiritual support;Social support;Critical Care  Spiritual Encounters  Spiritual Needs Emotional  Stress Factors  Patient Stress Factors Exhausted  Family Stress Factors Health changes;Major life changes   Chaplain followed up with patient today. Patient was sleeping but patient's daughter was in the room and available to talk. Patient's daughter said that her and her sister have been staying at the hospital since their mother had been admitted. Patient's daughter seems to have accepted her mother's condition and her mother's transition to comfort care. Patient's daughter said that her and her sister just don't want their mother to suffer any longer. Patient's daughter spoke about end of life for her mother comfortably and noted that they would take their mother back to her home in [place] to be buried. Chaplain will continue to provide emotional and spiritual support for patient and patient's family as needed. Gar Ponto, Chaplain  11:08 AM

## 2014-10-17 DIAGNOSIS — R451 Restlessness and agitation: Secondary | ICD-10-CM

## 2014-10-17 DIAGNOSIS — C7951 Secondary malignant neoplasm of bone: Secondary | ICD-10-CM

## 2014-10-17 DIAGNOSIS — K922 Gastrointestinal hemorrhage, unspecified: Principal | ICD-10-CM

## 2014-10-17 LAB — PATHOLOGIST SMEAR REVIEW

## 2014-10-17 MED ORDER — DILTIAZEM HCL 90 MG PO TABS
90.0000 mg | ORAL_TABLET | Freq: Four times a day (QID) | ORAL | Status: DC
  Administered 2014-10-17: 90 mg via ORAL
  Filled 2014-10-17 (×4): qty 1

## 2014-10-17 MED ORDER — HALOPERIDOL LACTATE 5 MG/ML IJ SOLN
2.0000 mg | Freq: Once | INTRAMUSCULAR | Status: AC
  Administered 2014-10-17: 2 mg via INTRAVENOUS
  Filled 2014-10-17: qty 1

## 2014-10-17 MED ORDER — SODIUM CHLORIDE 0.9 % IV SOLN
0.5000 mg/h | INTRAVENOUS | Status: DC
  Administered 2014-10-17 (×2): 0.5 mg/h via INTRAVENOUS
  Filled 2014-10-17: qty 2.5

## 2014-10-17 MED ORDER — HYDROMORPHONE BOLUS VIA INFUSION
0.5000 mg | INTRAVENOUS | Status: DC | PRN
  Filled 2014-10-17: qty 1

## 2014-10-17 MED ORDER — DIAZEPAM 5 MG/ML IJ SOLN
10.0000 mg | Freq: Four times a day (QID) | INTRAMUSCULAR | Status: DC
  Administered 2014-10-17: 10 mg via INTRAVENOUS
  Filled 2014-10-17: qty 2

## 2014-10-17 MED ORDER — ACETAMINOPHEN 325 MG PO TABS: 650.0000 mg | ORAL_TABLET | Freq: Three times a day (TID) | ORAL | Status: DC | PRN

## 2014-10-17 MED ORDER — DIGOXIN 250 MCG PO TABS
0.2500 mg | ORAL_TABLET | Freq: Every day | ORAL | Status: DC
  Administered 2014-10-17: 0.25 mg
  Filled 2014-10-17 (×2): qty 1

## 2014-11-10 NOTE — Progress Notes (Signed)
We will sign off. 

## 2014-11-10 NOTE — Progress Notes (Signed)
Family Medicine Teaching Service Daily Progress Note Intern Pager: [phone]  Patient name: Shelby Armstrong Medical record number: [id number] Date of birth: 23-Jan-1956 Age: 59 y.o. Gender: female  Primary Care Provider: Lupita Dawn, MD Consultants: GI Code Status: DNR  << according to Palliative note.  Pt Overview and Major Events to Date:  12/2: admitted for blood in PEG tube 12/6: palliative care consulted; changed to DNR status  Assessment and Plan: # Hypercalcemia. Patient's calcium on admission corrects to 14.7. Patient has prior history per chart review (Ca ~11). Likely related to patient's malignancy. Patient also appears clinically dehydrated likely secondary to hypercalcemia induced diuresis, or potentially dehydration may be contributing to her hypercalcemia. Her hypercalcemia may also be contributing to patient's overall fatigue and dehydration. Patient currently has normal mental status. She does note diffuse pain and constipation was noted on abd XR, which could be related to elevated calcium. - IVF KVO - Calcitonin  - Corrected Ca = 12.6 - Consider starting bisphosphonate therapy for long term control - will assess after discussion w/ pt/family about goals of care.  - Zoledronic acid if decision to start is made. - Continue to monitor for mental status changes - Monitor on telemetry  - No New Labs Today -- reducing sticks per palliative  #New Onset A-Fib; ST changes on EKG - EKG; ST elevations seen in leads V2-4 (12/6) - amlodipine being held - Diltiazem drip by cardiology. - Digoxin 0.$RemoveBef'0625mg'TDcAQyQpcd$  QD  - Metoprolol $RemoveBefo'50mg'fomAOmvsPJx$  BID - Troponins neg - TSH wnl  # GI bleed; suspect 2/2 esophageal stent placement. HgB on admission (11.0). No bright red blood to suggest acute GI bleed. -f/u gastric occult blood -Protonix $RemoveBeforeD'40mg'BkrqhxeyTqqYUo$  daily - Hgb 9.7 (12/6) - GI consulted -- appreciate the assistance.  # Esophageal Cancer. Previously followed at [facility]ology. Recently moved to [facility]  for second opinion. Had appointment on 12/7 with onc in [facility] provider to inform of pt's admission. Has diffuse body pains and weakness related to cancer diagnosis. 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  - Home tylenol and tramadol prn pain - Dilaudid infusion per palliative. - All nutrition through PEG tube - Nutrition consulted - NPO at this time - monitor respiratory status given chronic lung issues - Greatly appreciate the recs from Palliative care - Palliative discussed w/ patient and family--transferring to [facility], eventually to [facility]  # SIRS. Leukocytosis (13.4), tachycardia, and tachypnea (22) on admission. Tachycardia chronic per patient and her family. No obvious source of infection. UA and CXR unremarkable. Lactic acid normal. Likely tachy and tachypneic relating to disease burden. - EKG shows sinus tachycardia - f/u urine culture - Initiate broad spectrum antibiotics and obtain blood cultures if patient deteriorates clinically.  - Continue to monitor  # Chronic Anemia. - Continue home iron supplementation  # HTN.  - metoprolol; holding amlodipine  # Anxiety - continue home cymbalta  # Constipation: evident on imaging. Not a complaint of patient. - consider bowel regimen if no BM in am  # Right posterior thigh mass: concern would be for malignant process given history. Possibly a primary malignant process vs metastasis. - MRI femur to assess - mass w/ evidence of necrosis, likely a met - 2 additional (suspected) mets noted within femur and one in acetabulum   # Hyponatremia: stable from previous value 3 weeks ago. Potentially related to poor intake, though less likely with feedings through PEG tube. - IVF per  above - BMET in am.   FEN/GI: NPO, Nutrition consulted for tube feedings, NS @ KVO Prophylaxis: SCDs until GI bleed is ruled out.   Disposition: home when  deemed medically stable. Possibility for hospice or SNF if patient willing.  Subjective:  Joined by family. Asleep in bed. Was just up and talking to palliative provider so did not wake. Family had no new concerns today.  Objective: Temp:  [97.5 F (36.4 C)-100.5 F (38.1 C)] 97.7 F (36.5 C) (12/08 0812) Pulse Rate:  [85-123] 123 (12/08 0812) Resp:  [16-37] 21 (12/08 0812) BP: (150-170)/(67-115) 162/97 mmHg (12/08 0812) SpO2:  [87 %-97 %] 95 % (12/08 3734) Physical Exam: Gen: AAF lying in bed, appears older than stated age. Cachectic, chronically ill appearing. HEENT: EOMI, PERRL. MMM Heart: irr irr. Slight blowing murmur noted at mitral.  Lungs: Right side with good movement. Left side with hyper-resonant breath sounds  Abd: PEG in place without surrounding erythema. Scaphoid abdomen. Ext: Roughly 10 cm in diameter firm mass noted posterior Right thigh. Mildly TTP.  Laboratory:  Recent Labs Lab 10/13/14 2876 10/14/14 0327 10/15/14 0337  WBC 13.7* 14.6* 15.6*  HGB 9.7* 9.7* 9.7*  HCT 30.4* 27.6* 27.5*  PLT 877* 874* 902*    Recent Labs Lab 10/15/2014 1015 10/12/14 0616 10/13/14 0632 10/14/14 0327 10/15/14 0337  NA 132* 140 140 138 139  K 4.7 3.5* 3.6* 3.4* 4.0  CL 90* 100 101 98 100  CO2 _0 BUN _1 CREATININE 0.48* 0.44* 0.39* 0.39* 0.37*  CALCIUM 13.5* 10.2 10.5 10.8* 11.3*  PROT 9.1* 8.0  --   --  8.1  BILITOT 0.3 0.2*  --   --  0.2*  ALKPHOS 98 92  --   --  94  ALT 16 14  --   --  16  AST 47* 26  --   --  25  GLUCOSE 98 103* 81 97 102*    Imaging/Diagnostic Tests: KUB 12/2 IMPRESSION: No acute finding in the abdomen with a large volume of stool in the rectosigmoid colon.  Near complete white of the left chest compatible with effusion and atelectasis. There appears to be a small loculated left basilar Pneumothorax.  MRI Rt Femur 12/5 IMPRESSION: 1. There is a 4.3 x 4.9 x 7.8 cm avidly enhancing soft tissue  mass in the long head of the right biceps femoris muscle with nonenhancing areas within likely reflecting necrosis. Differential diagnosis includes primary malignancy such as pleomorphic undifferentiated sarcoma versus metastatic disease versus plasmacytoma given the hypointense lesions in the right proximal femur and right posterior acetabulum. 2. There are 2 bone lesions in the proximal right femur and a single bone lesion in the right posterior acetabulum concerning for metastatic disease versus multiple myeloma.   Elberta Leatherwood, MD Oct 27, 2014, 9:54 AM PGY-1, [place] Intern pager: [phone], text pages welcome

## 2014-11-10 NOTE — Progress Notes (Signed)
Chaplain responded to page to provide emotional,spiritual and grief support to a host of family member at bedside. Patient deceased.  I provided prayer,ministry of presence, empathetic listening and grief support to family. Facilitated  information sharing between staff and family. Offered final blessing upon patient and family.

## 2014-11-10 NOTE — Progress Notes (Signed)
Family Practice Teaching Service Interval Progress Note  Received call from nursing that patient is complaining of pain at this time. Went to see patient. She is arrousable and her main complaint is her dry mouth and desire for water at this time. Daughter reports that patient will wake up and started saying dead peoples names, then will go back to sleep. She is due to get valium at this time. Will see how she does with this medication. Continue to monitor.  Tommi Rumps, MD Family Medicine PGY-3 Service Pager [phone]

## 2014-11-10 NOTE — Discharge Summary (Signed)
[facility] Discharge Summary  Patient name: Shelby Armstrong Medical record number: [id number] Date of birth: 12-18-1955 Age: 59 y.o. Gender: female Date of Admission: 10/27/2014  Date of Discharge: October 24, 2014 Admitting Physician: Willeen Niece, MD  Primary Care Provider: Lupita Dawn, MD Consultants: Palliative care, cardiology  Indication for Hospitalization: Suspected GI bleed.  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 Patient denied any prior occurrences of this. She did complain of some nausea and non-bloody vomitus prior to admission. Patient is a history of esophageal squamous cell carcinoma. Patient has a esophageal stent placed. She previously been followed at [facility], but was told that her cancer was terminal and that she should pursue hospice care. She and the family decided that she needed a second opinion so they moved her to the area to have that second opinion. Patient did receive radiation therapy approximately 3 weeks prior to admission. No further complaints at that time.  Patient was noted to have an initial workup significant for hypercalcemia, 13.5, hyponatremia, 132, leukocytosis, 13.4. Chest x-ray revealed white out of the left lung and mild left-sided basilar pneumothorax.  Patient was given normal saline at 200 mL per hour as well as calcitonin and Lasix to help correct her hypercalcemia. Consideration for bisphosphonate therapy was made however was later deemed an appropriate later her stay. GI was  consulted for positive gastric occult blood. Patient was started on protonix 40 mg daily and conservative treatment was recommended.  On 12/4 patient was noted to flip into atrial fibrillation with RVR. Cardiology was consulted. Patient was started on a diltiazem drip and was transferred to stepdown unit. Rate control was eventually obtained on diltiazem drip and metoprolol 50 mg twice a day. Echocardiogram was performed on 12/5. Cardiology also began digoxin on 12/7.  MRI of patient's right femur was obtained to assess a large 5 x 8 cm mass on patient's posterior thigh. Mass showed evidence of necrosis and soft tissue changes suggestive of metastasis versus primary lesion. Incidental findings included 2 lesions within the femur suggestive of bony metastases, and one lesion within the acetabulum also suggestive of metastasis.  Patient's condition gradually worsened over time. Significant discussion was had with the patient's family about the long-term prognosis with patient's current condition. After these discussions it was decided to place patient primarily on comfort care with oversight by palliative care consultation. Diltiazem drip was discontinued patient was placed on oral diltiazem. Patient was transferred out of stepdown unit onto 6 N. Unfortunately passed away prior to her discharge to [facility]. Condolences to family was given.   Issues for Follow Up:  n/a  Significant Procedures: none  Significant Labs and Imaging:   Recent Labs Lab 10/13/14 0632 10/14/14 0327 10/15/14 0337  WBC 13.7* 14.6* 15.6*  HGB 9.7* 9.7* 9.7*  HCT 30.4* 27.6* 27.5*  PLT 877* 874* 902*    Recent Labs Lab 11/09/2014 1015 10/12/14 0616 10/13/14 0632 10/14/14 0327 10/15/14 0337  NA 132* 140 140 138 139  K 4.7 3.5* 3.6* 3.4* 4.0  CL 90* 100 101 98 100  CO2 28 25 24 26 25   GLUCOSE  98 103* 81 97 102*  BUN 21 16 12 13 13   CREATININE 0.48* 0.44* 0.39* 0.39* 0.37*  CALCIUM 13.5* 10.2 10.5 10.8*  11.3*  ALKPHOS 98 92  --   --  94  AST 47* 26  --   --  25  ALT 16 14  --   --  16  ALBUMIN 2.5* 2.2*  --   --  2.3*   MRI Femur-Rt 12/5 IMPRESSION: 1. There is a 4.3 x 4.9 x 7.8 cm avidly enhancing soft tissue mass in the long head of the right biceps femoris muscle with nonenhancing areas within likely reflecting necrosis. Differential diagnosis includes primary malignancy such as pleomorphic undifferentiated sarcoma versus metastatic disease versus plasmacytoma given the hypointense lesions in the right proximal femur and right posterior acetabulum. 2. There are 2 bone lesions in the proximal right femur and a single bone lesion in the right posterior acetabulum concerning for metastatic disease versus multiple myeloma.   Results/Tests Pending at Time of Discharge: n/a  Discharge Medications:    Medication List    ASK your doctor about these medications        acetaminophen 325 MG tablet  Commonly known as:  TYLENOL  Take 650 mg by mouth every 8 (eight) hours as needed for mild pain.     amLODipine 10 MG tablet  Commonly known as:  NORVASC  10 mg by PEG Tube route daily.     aspirin 325 MG EC tablet  Take 325 mg by mouth daily.     CYMBALTA 30 MG capsule  Generic drug:  DULoxetine  Take 30 mg by mouth daily.     esomeprazole 40 MG packet  Commonly known as:  NEXIUM  Take 40 mg by mouth daily before breakfast.     feeding supplement (JEVITY 1.5 CAL) Liqd  Place 1,000 mLs into feeding tube 4 (four) times daily.     Iron Dextran-Folic XNTZ-G01 [id number] MG-MCG/5ML Liqd  100 mcg by PEG Tube route daily.     metoprolol 50 MG tablet  Commonly known as:  LOPRESSOR  50 mg by PEG Tube route 2 (two) times daily.     multivitamin tablet  Take 1 tablet by mouth daily.     thiamine 100 MG tablet  100 mg by PEG Tube route daily.     traMADol 50 MG tablet  Commonly known as:  ULTRAM  Take 1 tablet (50 mg total) by mouth every 8 (eight) hours as needed.         Discharge Instructions: Please refer to Patient Instructions section of EMR for full details.  Patient was counseled important signs and symptoms that should prompt return to medical care, changes in medications, dietary instructions, activity restrictions, and follow up appointments.   Follow-Up Appointments:   Elberta Leatherwood, MD 11/10/14, 12:16 PM PGY-1, [place]

## 2014-11-10 NOTE — Clinical Social Work Psychosocial (Signed)
Clinical Social Work Department BRIEF PSYCHOSOCIAL ASSESSMENT 06-Nov-2014  Patient:  Shelby [facility], [facility]     Account Number:  [id number]     Admit date:  10/12/2014  Clinical Social Worker:  Shelby [facility], [facility]  Date/Time:  November 06, 2014 01:40 PM  Referred by:  Physician  Date Referred:  11-06-14 Referred for  Residential hospice placement   Other Referral:   Interview type:  Family Other interview type:    PSYCHOSOCIAL DATA Living Status:  FAMILY Admitted from facility:   Level of care:   Primary support name:  Shelby Armstrong Primary support relationship to patient:  CHILD, ADULT Degree of support available:   Patient has high level of support from 2 daughters.    CURRENT CONCERNS Current Concerns  Post-Acute Placement   Other Concerns:    SOCIAL WORK ASSESSMENT / PLAN CSW spoke with patients daughter concerning residential hospice placement.  Patients daughter is agreeable to hospice placement and prefers [facility]- if not [facility] than family would prefer [facility].   Assessment/plan status:  Psychosocial Support/Ongoing Assessment of Needs Other assessment/ plan:   referral to [facility]   Information/referral to community resources:   S[facility]    PATIENT'S/FAMILY'S RESPONSE TO PLAN OF CARE: Patient is agreeable to plan for hospice at [facility]- family does not want patient moved to hospice out side of [facility] because they are hopeful that they will get to spend the patients last moments with her.       Shelby Armstrong, [place] Social Worker [phone]

## 2014-11-10 NOTE — Progress Notes (Signed)
Palliative Care Team at [place] Note   SUBJECTIVE: Very agitated. Significant distress-daughters at the bedside.  OBJECTIVE: Vital Signs: BP 162/97 mmHg  Pulse 123  Temp(Src) 97.7 F (36.5 C) (Oral)  Resp 21  Ht 5\' 1"  (1.549 m)  Wt 37.558 kg (82 lb 12.8 oz)  BMI 15.65 kg/m2  SpO2 95%   Intake and Output: 12/07 0701 - 12/08 0700 In: 1331 [I.V.:620; NG/GT:711] Out: 600 [Urine:600]  Physical Exam: General: Frail, confused agitated  Head: normal  Lungs:  No breath sounds on left- right with course rhonchi-very congested  Heart: Tachycardic  Abdomen:  soft  Extremities: Thin, no wounds    No Known Allergies  Medications: Scheduled Meds:  . antiseptic oral rinse  7 mL Mouth Rinse BID  . chlorhexidine  15 mL Mouth Rinse BID  . diazepam  10 mg Intravenous 4 times per day  . digoxin  0.25 mg Per Tube Daily  . diltiazem  90 mg Oral 4 times per day  . feeding supplement (OSMOLITE 1.5 CAL)  237 mL Per Tube TID  . haloperidol lactate  2 mg Intravenous Once  . ipratropium  0.5 mg Nebulization Q4H  . metoprolol  50 mg Per Tube BID  . pantoprazole sodium  40 mg Per Tube Daily  . potassium chloride  20 mEq Oral Daily  . senna-docusate  1 tablet Per Tube QHS  . sodium chloride  3 mL Intravenous Q12H    Continuous Infusions: . sodium chloride 10 mL/hr at 10/22/2014 0800  . 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 Prior trach, has a PEG tube. She is actively dying-having terminal delirium. Severe pain in her legs and dyspnea from lung collapse-complete white out on CXR.  1. Agitation: I haldol q6 prn for agitation, increased her diazepam to 10mg  q 6hours  2. Pain: she respond well to IV dilaudid but has received a dose almost every 2 hours-  will initiate a continuous infusion for her symptom management, bolus dosing every 30 minutes for uncontrolled pain or distress.  3. Disposition: Patient's two daughters were at bedside- I discussed next steps in comfort care and that she would be a good candidate for a hospice facility- they specifically asked about [facility]. Will transition our of step down unit to 6N and place referral for a hospice facility.  Other med management: -change diltiazem and digoxin over to per tube -stop unnecessary medications not related to comfort  Daughters agree to above plan.  Time: 9AM-10AM Time: 50 minutes Greater than 50%  of this time was spent counseling and coordinating care related to the above assessment and plan.   Acquanetta Chain, DO  October 22, 2014, 9:52 AM  Please contact Palliative Medicine Team phone at [phone] for questions and concerns.

## 2014-11-10 NOTE — Progress Notes (Signed)
Ascultation was performed for one minute with Collene Mares, RN. No heart or lung sounds were heard. 25 ml Dilaudid was also wasted in the sink. All patient belongings were returned to the family and condolences were given. Pastoral care was called and offered support to the family at bedside.    Coletta Memos, RN

## 2014-11-10 NOTE — Clinical Social Work Note (Signed)
CSW informed that patient is deceased.    CSW informed Production assistant, radio.  CSW signing off.  Domenica Reamer, [place] Social Worker [phone]

## 2014-11-10 NOTE — Progress Notes (Signed)
Patient becoming increasingly agitated, moaning, and restless in bed. BP elevated, HR 120's-130's. Notified Dr. Caryl Bis, who is now at bedside. No new orders at this time, will continue to monitor patient closely.  Shelby Armstrong

## 2014-11-10 NOTE — Clinical Social Work Note (Signed)
2H CSW has made referral for patient to [facility].  CSW will continue to follow.  Domenica Reamer, [place] Social Worker [phone]

## 2014-11-10 DEATH — deceased

## 2015-01-05 IMAGING — CR DG ABDOMEN ACUTE W/ 1V CHEST
3 series · 3 of 3 positions shown · non-contrast
Comparison: None.

CLINICAL DATA: Diffuse abdominal pain and nausea. History of
esophageal carcinoma.

EXAM:
ACUTE ABDOMEN SERIES (ABDOMEN 2 VIEW & CHEST 1 VIEW)

[x chest ap]
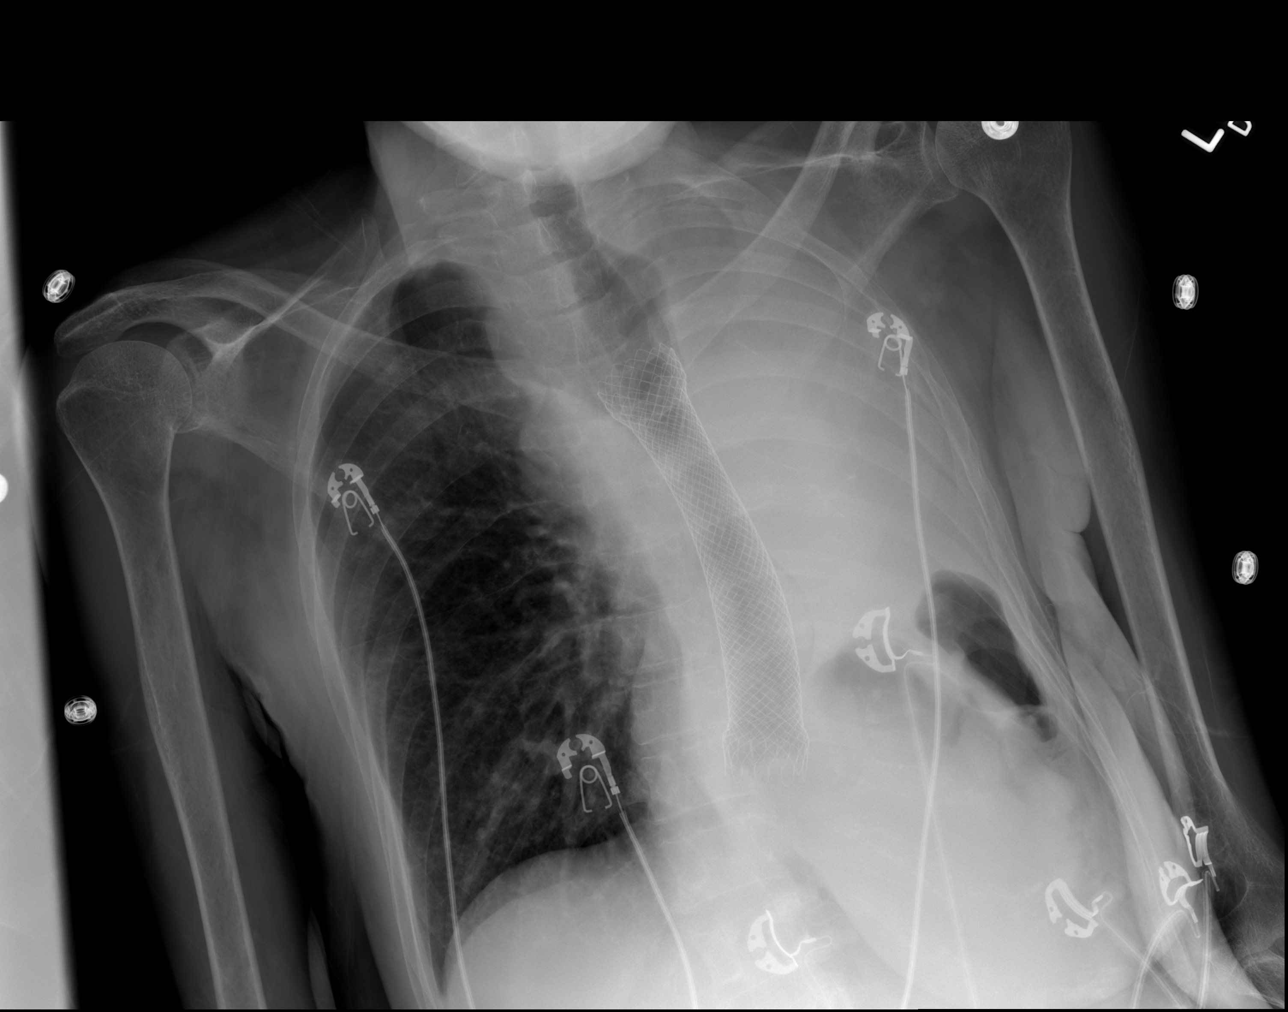

[x abdomen supine]
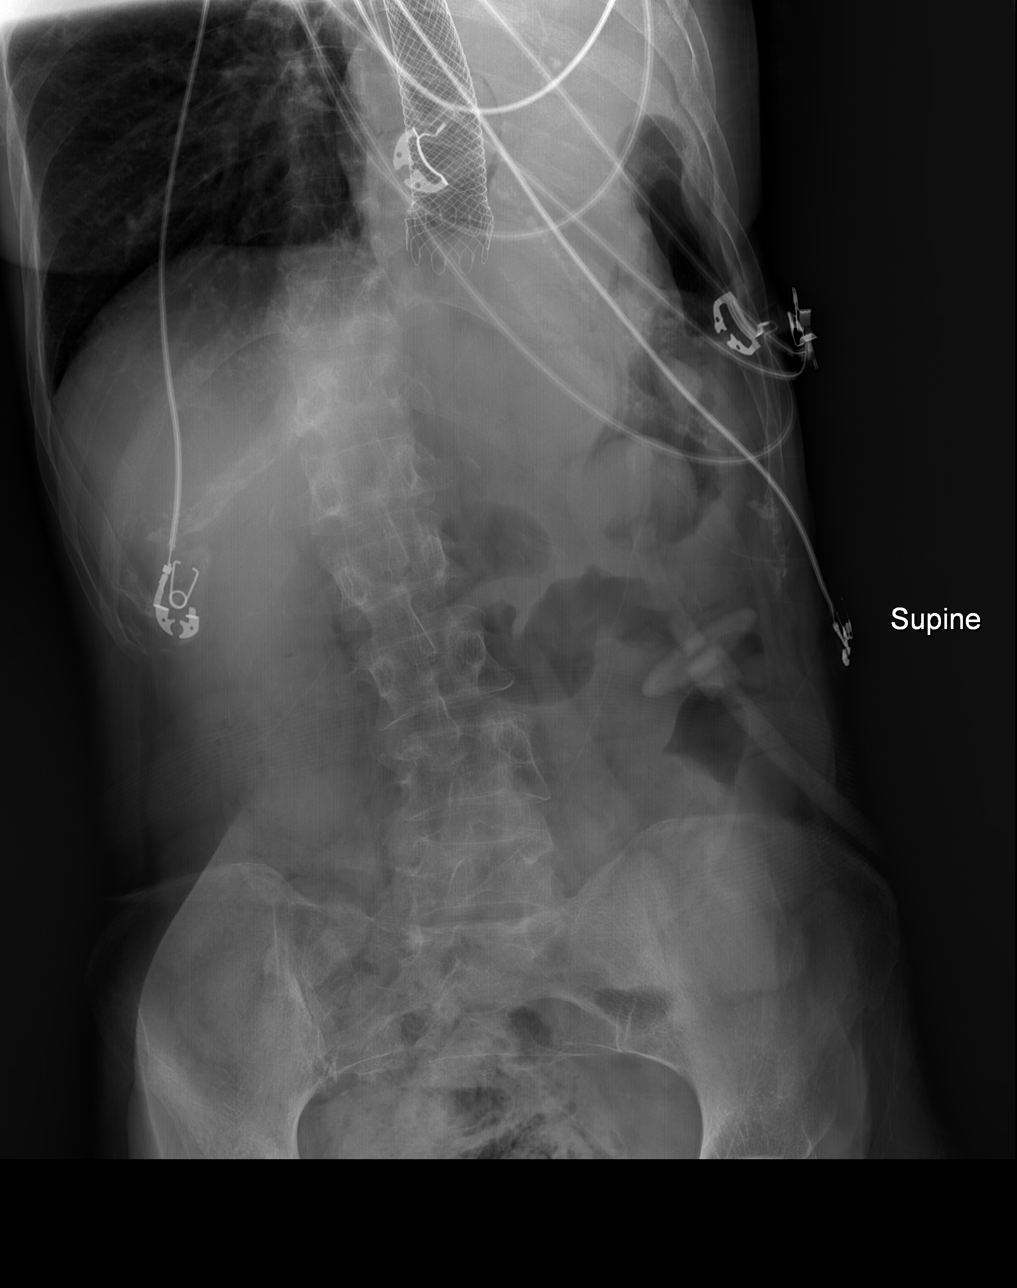

[w abdomen decub]
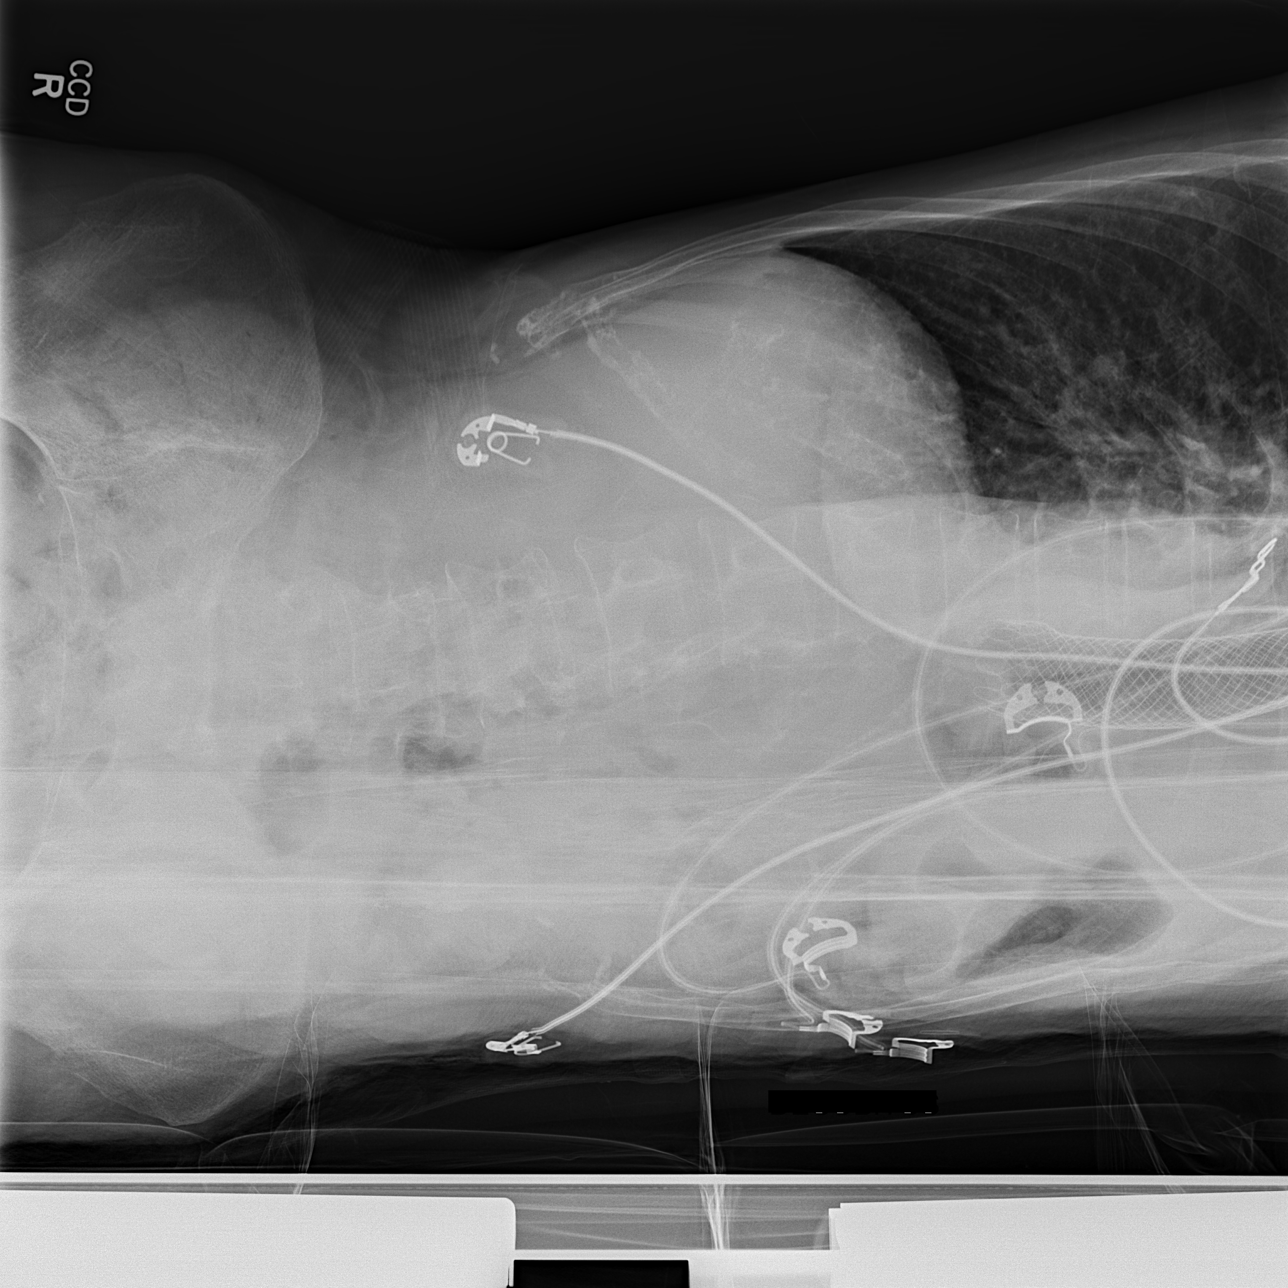

[3 of 3 positions shown; findings below may reference images not displayed]

FINDINGS: Esophageal stent is in place. There is near complete white out of
the left chest with volume loss. Small loculated pneumothorax is
seen in the left lower lung zone. The right lung is clear.

Two views of the abdomen show a PEG tube in place which projects in
good position. No free intraperitoneal air or evidence of bowel
obstruction is identified. Large volume of stool in the rectosigmoid
colon is noted.
IMPRESSION: No acute finding in the abdomen with a large volume of stool in the
rectosigmoid colon.

Near complete white of the left chest compatible with effusion and
atelectasis. There appears to be a small loculated left basilar
pneumothorax.

## 2015-01-08 IMAGING — CR DG CHEST 1V PORT
1 series · 1 of 1 positions shown · non-contrast
Comparison: Chest radiograph performed 10/11/2014

CLINICAL DATA: Acute onset of wheezing and shortness of breath
today. Initial encounter.

EXAM:
PORTABLE CHEST - 1 VIEW

[AP]
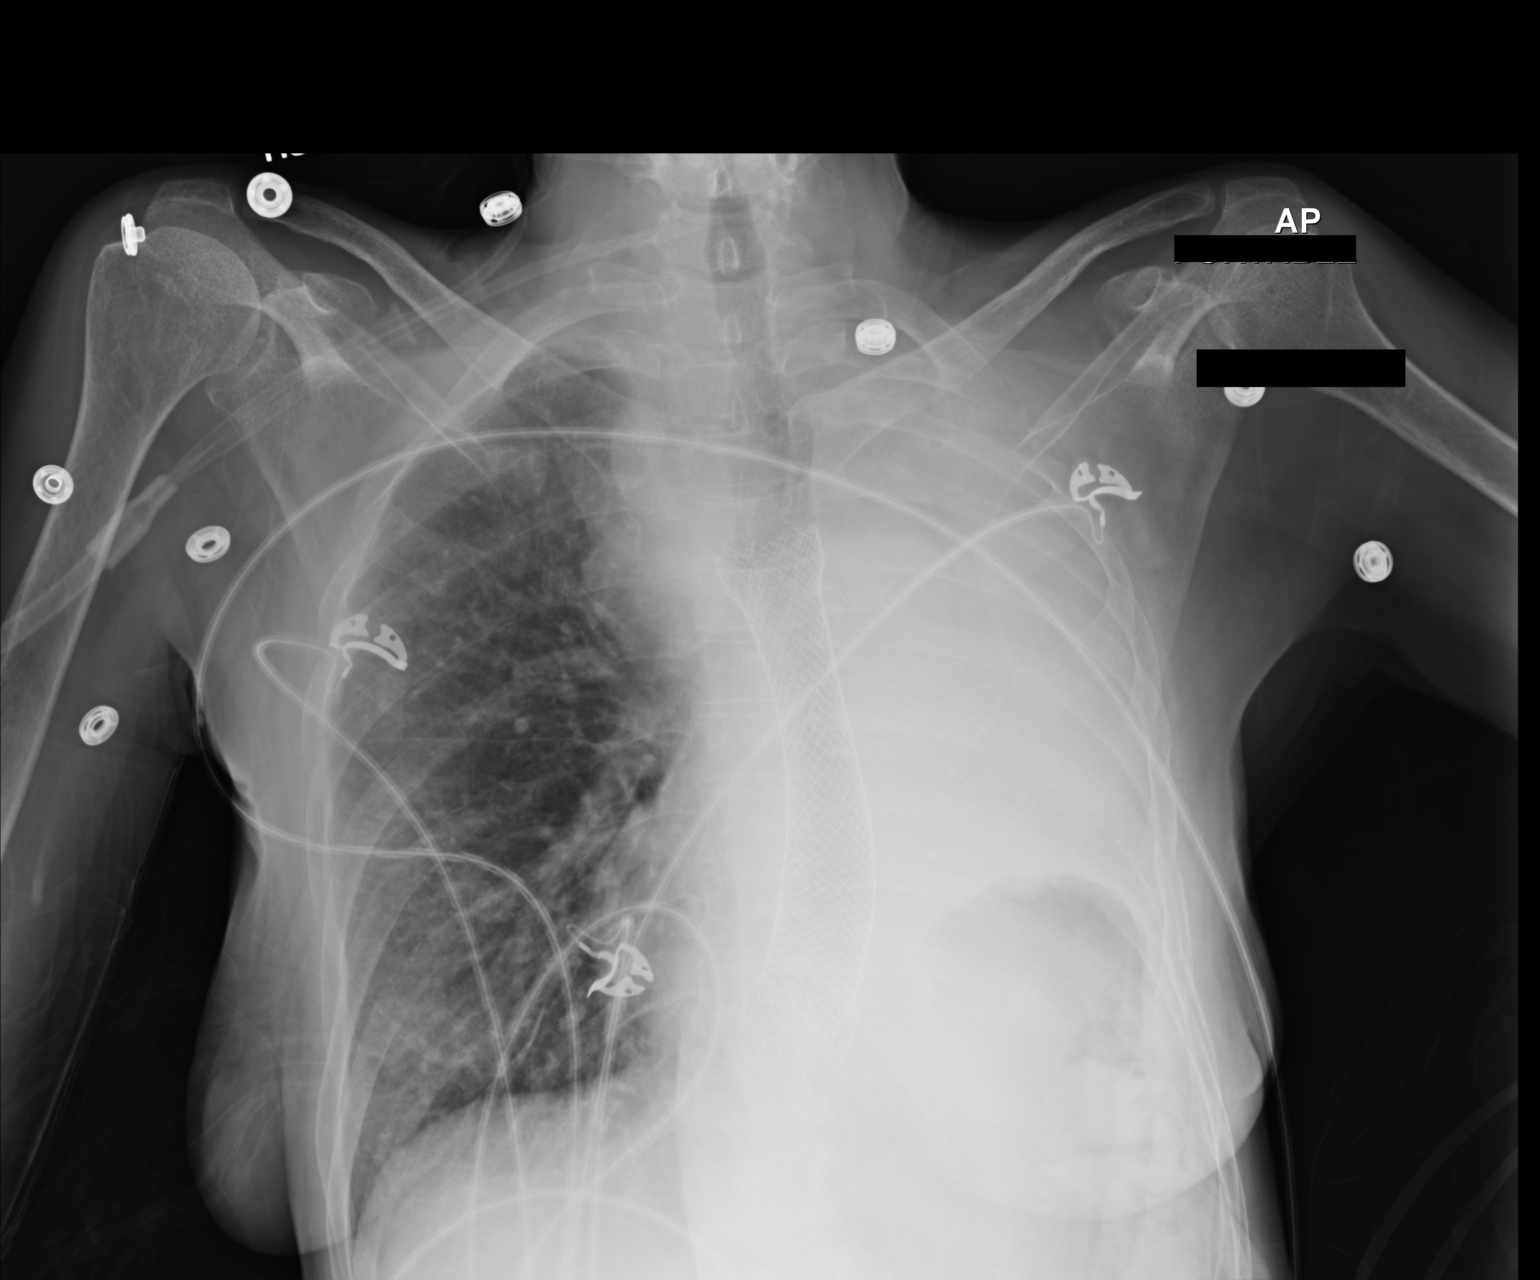

[1 of 1 positions shown; findings below may reference images not displayed]

FINDINGS: There is persistent complete opacification of the left hemithorax,
reflecting a combination of pleural effusion and atelectasis.
Leftward mediastinal shift is again seen. Mild vascular congestion
is noted at the right lung, with mild right basilar atelectasis. No
pneumothorax is seen.

An esophageal stent is noted. The cardiomediastinal silhouette is
not well assessed due to opacification of the left hemithorax. No
acute osseous abnormalities are identified. There is elevation of
the left hemidiaphragm, with underlying air in the stomach and
splenic flexure of the colon.
IMPRESSION: 1. Stable complete opacification of the left hemithorax, reflecting
a combination of pleural effusion and atelectasis.
2. Mild vascular congestion at the right lung, with mild right
basilar atelectasis.

## 2015-01-09 IMAGING — CR DG CHEST 1V PORT SAME DAY
1 series · 1 of 1 positions shown · non-contrast
Comparison: None.

CLINICAL DATA: Wheezing.  Short of breath.

EXAM:
PORTABLE CHEST - 1 VIEW SAME DAY

[AP]
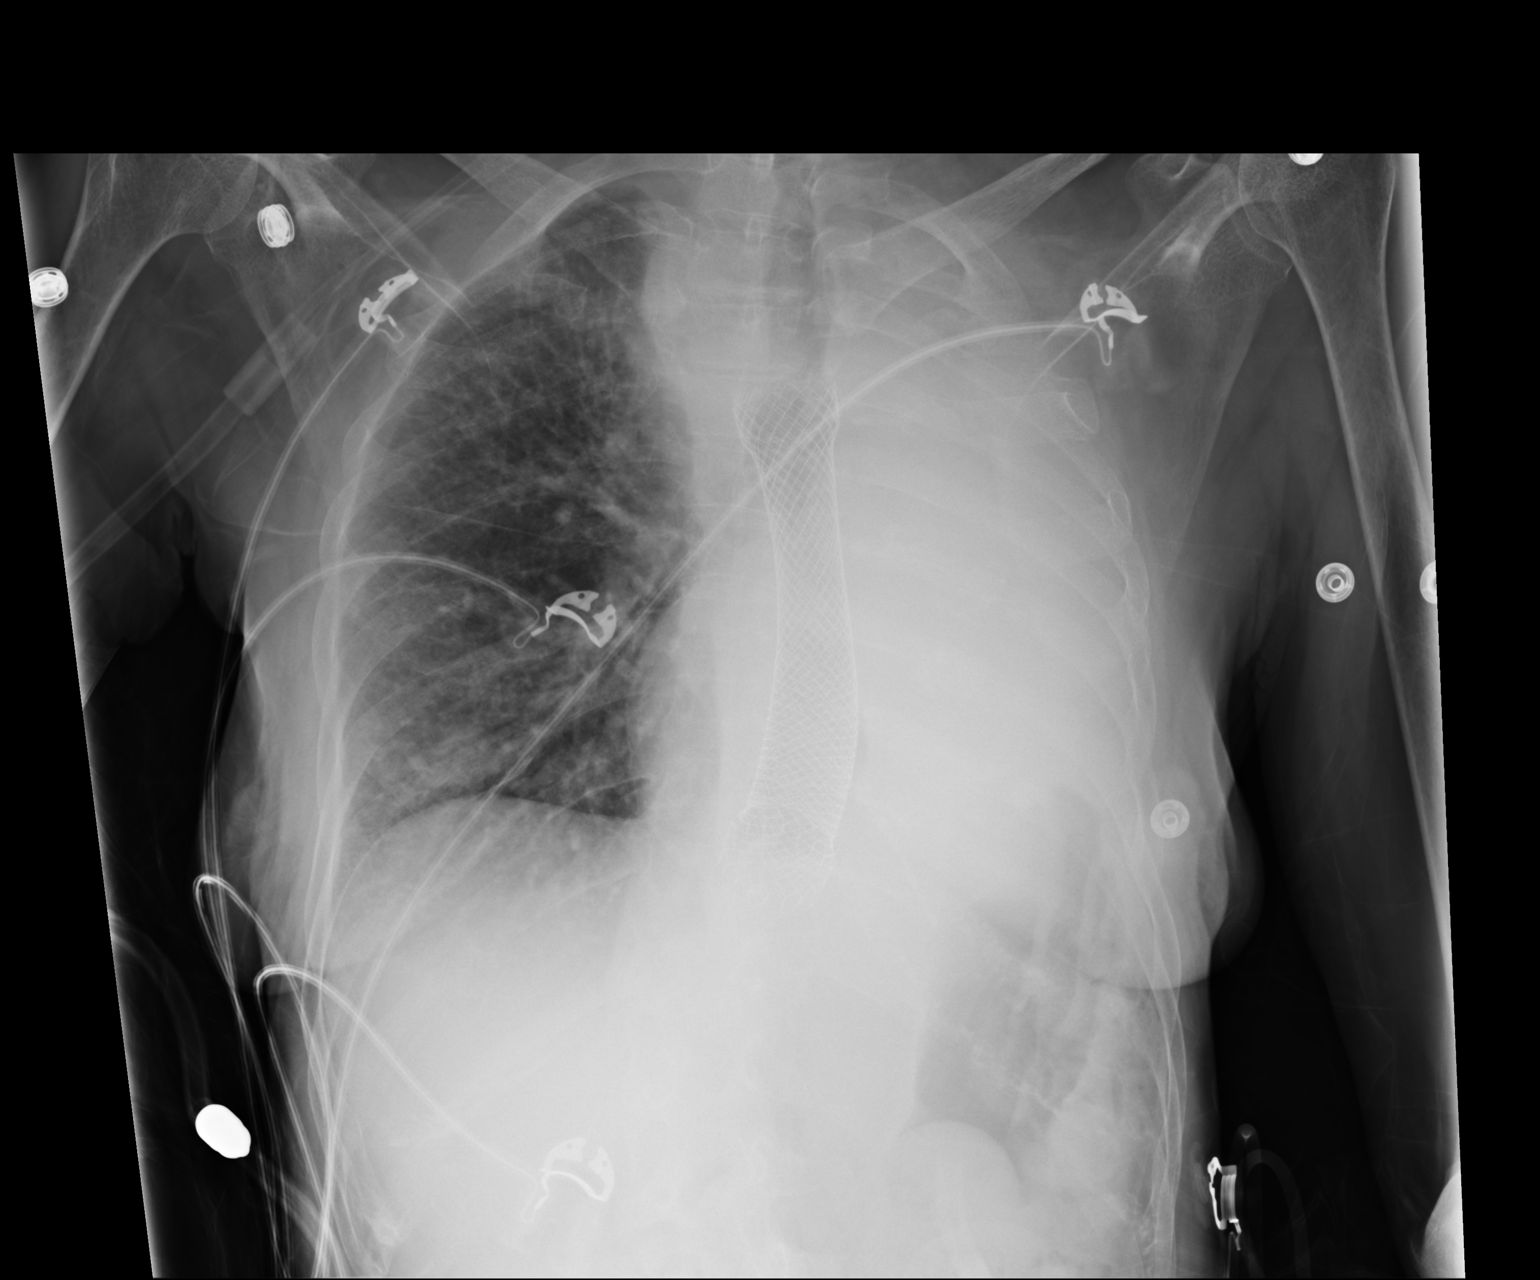

[1 of 1 positions shown; findings below may reference images not displayed]

FINDINGS: Stable esophageal stent. Complete opacification of the left hemi
thorax with shift of the mediastinum to the left is stable. Right
peritracheal soft tissue mass stable. Reticulonodular opacities
throughout the right lung are stable. This is worrisome for edema.
IMPRESSION: Stable opacification of the left lung with mediastinal shift likely
lung collapse. Pleural effusion is not excluded.

Edema throughout the right lung is suspected
